# Patient Record
Sex: Female | Born: 1960 | Race: White | Hispanic: No | Marital: Married | Smoking: Former smoker
Health system: Southern US, Community
[De-identification: ages and names within clinical notes are randomized; demographics above are authoritative.]

## PROBLEM LIST (undated history)

## (undated) DIAGNOSIS — G43909 Migraine, unspecified, not intractable, without status migrainosus: Secondary | ICD-10-CM

## (undated) DIAGNOSIS — Z9109 Other allergy status, other than to drugs and biological substances: Secondary | ICD-10-CM

## (undated) DIAGNOSIS — I10 Essential (primary) hypertension: Secondary | ICD-10-CM

## (undated) DIAGNOSIS — G43109 Migraine with aura, not intractable, without status migrainosus: Secondary | ICD-10-CM

## (undated) DIAGNOSIS — G43111 Migraine with aura, intractable, with status migrainosus: Secondary | ICD-10-CM

## (undated) DIAGNOSIS — R42 Dizziness and giddiness: Secondary | ICD-10-CM

## (undated) HISTORY — PX: FRACTURE SURGERY: SHX138

## (undated) HISTORY — PX: NASAL SINUS SURGERY: SHX719

## (undated) HISTORY — PX: GANGLION CYST EXCISION: SHX1691

## (undated) HISTORY — PX: TOTAL ABDOMINAL HYSTERECTOMY: SHX209

## (undated) HISTORY — PX: SHOULDER ARTHROSCOPY: SHX128

## (undated) HISTORY — DX: Essential (primary) hypertension: I10

## (undated) HISTORY — PX: LUMBAR FUSION: SHX111

---

## 2015-07-16 ENCOUNTER — Encounter: Payer: Self-pay | Admitting: Neurology

## 2015-07-16 ENCOUNTER — Ambulatory Visit (INDEPENDENT_AMBULATORY_CARE_PROVIDER_SITE_OTHER): Payer: BLUE CROSS/BLUE SHIELD | Admitting: Neurology

## 2015-07-16 VITALS — BP 114/74 | HR 83 | Ht 66.0 in | Wt 184.0 lb

## 2015-07-16 DIAGNOSIS — G43009 Migraine without aura, not intractable, without status migrainosus: Secondary | ICD-10-CM | POA: Diagnosis not present

## 2015-07-16 DIAGNOSIS — G43109 Migraine with aura, not intractable, without status migrainosus: Secondary | ICD-10-CM | POA: Diagnosis not present

## 2015-07-16 DIAGNOSIS — G43809 Other migraine, not intractable, without status migrainosus: Secondary | ICD-10-CM | POA: Insufficient documentation

## 2015-07-16 MED ORDER — PREDNISONE 10 MG PO TABS: ORAL_TABLET | ORAL | Status: DC

## 2015-07-16 NOTE — Patient Instructions (Signed)
1.  To help stop the dizziness, will try prednisone taper.  Take 6tabs x1day, then 5tabs x1day, then 4tabs x1day, then 3tabs x1day, then 2tabs x1day, then 1tab x1day, then STOP 2.  Continue amitriptyline 25mg  at bedtime. 3.  Will get MRI and MRA of the head. 4.  Follow up in 3 months but call in 4 weeks (or sooner) if needed.

## 2015-07-16 NOTE — Progress Notes (Signed)
Chart forwarded.  

## 2015-07-16 NOTE — Progress Notes (Signed)
NEUROLOGY CONSULTATION NOTE  Christy Clayton MRN: [id number] DOB: [birth year]  Referring provider: Dr. Hyman Hopes Primary care provider: Dr. Hyman Hopes  Reason for consult:  Vestibular migraine  HISTORY OF PRESENT ILLNESS: Christy Clayton is a 54 year old right-handed female who presents for vestibular migraine.  History obtained by patient and PCP note.  She has had migraines "all of my life."  They start in the back of her neck and radiate to the left side of her head and face.  It is a 10/10 throbbing pain.  They are associated with blurred vision, nausea, vomiting, photophobia and phonophobia.  They last several hours without medication but abort in 2 hours after two dose of Frova.  They occur 4-5 times a year.  There are no particular triggers.  Her father has migraines.  Over the past 5 years, she has experienced vertigo and dizziness following some migraines.  It seems to occur in the autumn or spring.  She initially feels spinning sensation which becomes a persistent lightheadedness.  She continues to feel nauseous.  They usually last a week but last autumn and currently, it has persisted for 5 weeks.  Last year, she was out of work for a month.  Her last migraine was 5 weeks ago, and she continues to have persistent dizziness.  She has seen ENT in the past with unremarkable evaluation.  Current abortive medication:  Frova, meclizine , Zofran  Current preventative medication:  Amitriptyline  (just increased from  on 07/04/15).  Past abortive medication:  Imitrex.  Unable to use injections due to anxiety. Past preventative medication:  none  PAST MEDICAL HISTORY: Past Medical History  Diagnosis Date  . Diabetes mellitus without complication (HCC)   . Hypertension     PAST SURGICAL HISTORY: Past Surgical History  Procedure Laterality Date  . Total abdominal hysterectomy    . Ganglion cyst excision Left     Wrist    MEDICATIONS: No current outpatient prescriptions on file  prior to visit.   No current facility-administered medications on file prior to visit.    ALLERGIES: Allergies  Allergen Reactions  . Allegra [Fexofenadine] Nausea And Vomiting  . Celebrex [Celecoxib]   . Codeine   . Neurontin [Gabapentin] Nausea And Vomiting  . Promethazine Nausea And Vomiting  . Sulfa Antibiotics Nausea And Vomiting  . Cortaid [Hydrocortisone] Rash    FAMILY HISTORY: History reviewed. No pertinent family history.  SOCIAL HISTORY: Social History   Social History  . Marital Status: Married    Spouse Name: N/A  . Number of Children: N/A  . Years of Education: N/A   Occupational History  . Not on file.   Social History Main Topics  . Smoking status: Former Games developer  . Smokeless tobacco: Not on file  . Alcohol Use: Not on file  . Drug Use: Not on file  . Sexual Activity: Not on file   Other Topics Concern  . Not on file   Social History Narrative    REVIEW OF SYSTEMS: Constitutional: No fevers, chills, or sweats, no generalized fatigue, change in appetite Eyes: No visual changes, double vision, eye pain Ear, nose and throat: No hearing loss, ear pain, nasal congestion, sore throat Cardiovascular: No chest pain, palpitations Respiratory:  No shortness of breath at rest or with exertion, wheezes GastrointestinaI: No nausea, vomiting, diarrhea, abdominal pain, fecal incontinence Genitourinary:  No dysuria, urinary retention or frequency Musculoskeletal:  No neck pain, back pain Integumentary: No rash, pruritus, skin lesions Neurological: as above Psychiatric: No  depression, insomnia, anxiety Endocrine: No palpitations, fatigue, diaphoresis, mood swings, change in appetite, change in weight, increased thirst Hematologic/Lymphatic:  No anemia, purpura, petechiae. Allergic/Immunologic: no itchy/runny eyes, nasal congestion, recent allergic reactions, rashes  PHYSICAL EXAM: Filed Vitals:   07/16/15 0804  BP: 114/74  Pulse: 83   General: No acute  distress.  Patient appears well-groomed.  Head:  Normocephalic/atraumatic Eyes:  fundi unremarkable, without vessel changes, exudates, hemorrhages or papilledema. Neck: supple, no paraspinal tenderness, full range of motion Back: No paraspinal tenderness Heart: regular rate and rhythm Lungs: Clear to auscultation bilaterally. Vascular: No carotid bruits. Neurological Exam: Mental status: alert and oriented to person, place, and time, recent and remote memory intact, fund of knowledge intact, attention and concentration intact, speech fluent and not dysarthric, language intact. Cranial nerves: CN I: not tested CN II: pupils equal, round and reactive to light, visual fields intact, fundi unremarkable, without vessel changes, exudates, hemorrhages or papilledema. CN III, IV, VI:  full range of motion, no nystagmus, no ptosis CN V: facial sensation intact CN VII: upper and lower face symmetric CN VIII: hearing intact CN IX, X: gag intact, uvula midline CN XI: sternocleidomastoid and trapezius muscles intact CN XII: tongue midline Bulk & Tone: normal, no fasciculations. Motor:  5/5 throughout Sensation: temperature and vibration sensation intact. Deep Tendon Reflexes:  2+ throughout, toes downgoing.  Finger to nose testing:  Without dysmetria.  Heel to shin:  Without dysmetria.  Gait:  Normal station and stride.  Able to turn and tandem walk, but cautiously. Romberg with mild sway.  IMPRESSION: Possible vestibular migraines or migraine associated vertigo  PLAN: 1.  Since symptoms are persistent for the past 5 weeks, will get MRI and MRA of the head to look at the vertebrobasilar system 2.  Continue current regimen of Frova and Zofran 8mg  and meclizine as needed.  Ideally, an injectable triptan and anti-emetic would be ideal but this injections cause anxiety 3.  Continue amitriptyline 25mg  daily 4.  Will prescribe prednisone taper. 5.  Follow up in 3 months but she is to call sooner if  needed.  Consider vestibular rehab  Thank you for allowing me to take part in the care of this patient.  Shon MilletAdam Jaffe, DO  CC: Shirlean Mylararol Webb, MD

## 2015-07-17 ENCOUNTER — Encounter (HOSPITAL_COMMUNITY): Payer: Self-pay | Admitting: *Deleted

## 2015-07-17 ENCOUNTER — Observation Stay (HOSPITAL_COMMUNITY)
Admission: EM | Admit: 2015-07-17 | Discharge: 2015-07-19 | Disposition: A | Discharge status: Home / Self Care | Payer: BLUE CROSS/BLUE SHIELD | Attending: Internal Medicine | Admitting: Internal Medicine

## 2015-07-17 ENCOUNTER — Emergency Department (HOSPITAL_COMMUNITY): Payer: BLUE CROSS/BLUE SHIELD

## 2015-07-17 DIAGNOSIS — I671 Cerebral aneurysm, nonruptured: Secondary | ICD-10-CM | POA: Insufficient documentation

## 2015-07-17 DIAGNOSIS — Z79899 Other long term (current) drug therapy: Secondary | ICD-10-CM | POA: Diagnosis not present

## 2015-07-17 DIAGNOSIS — R200 Anesthesia of skin: Secondary | ICD-10-CM

## 2015-07-17 DIAGNOSIS — G5602 Carpal tunnel syndrome, left upper limb: Secondary | ICD-10-CM | POA: Diagnosis not present

## 2015-07-17 DIAGNOSIS — G43809 Other migraine, not intractable, without status migrainosus: Secondary | ICD-10-CM | POA: Diagnosis not present

## 2015-07-17 DIAGNOSIS — Z7982 Long term (current) use of aspirin: Secondary | ICD-10-CM | POA: Insufficient documentation

## 2015-07-17 DIAGNOSIS — G43109 Migraine with aura, not intractable, without status migrainosus: Secondary | ICD-10-CM

## 2015-07-17 DIAGNOSIS — R531 Weakness: Secondary | ICD-10-CM

## 2015-07-17 DIAGNOSIS — R202 Paresthesia of skin: Secondary | ICD-10-CM | POA: Diagnosis present

## 2015-07-17 DIAGNOSIS — I10 Essential (primary) hypertension: Secondary | ICD-10-CM | POA: Diagnosis not present

## 2015-07-17 DIAGNOSIS — E876 Hypokalemia: Secondary | ICD-10-CM | POA: Insufficient documentation

## 2015-07-17 DIAGNOSIS — I639 Cerebral infarction, unspecified: Secondary | ICD-10-CM

## 2015-07-17 HISTORY — DX: Migraine, unspecified, not intractable, without status migrainosus: G43.909

## 2015-07-17 HISTORY — DX: Other allergy status, other than to drugs and biological substances: Z91.09

## 2015-07-17 LAB — CBC
HCT: 42.5 % (ref 36.0–46.0)
HEMOGLOBIN: 14.5 g/dL (ref 12.0–15.0)
MCH: 30.5 pg (ref 26.0–34.0)
MCHC: 34.1 g/dL (ref 30.0–36.0)
MCV: 89.3 fL (ref 78.0–100.0)
Platelets: 351 10*3/uL (ref 150–400)
RBC: 4.76 MIL/uL (ref 3.87–5.11)
RDW: 12.4 % (ref 11.5–15.5)
WBC: 10.5 10*3/uL (ref 4.0–10.5)

## 2015-07-17 LAB — BASIC METABOLIC PANEL
ANION GAP: 11 (ref 5–15)
BUN: 16 mg/dL (ref 6–20)
CALCIUM: 9.7 mg/dL (ref 8.9–10.3)
CO2: 23 mmol/L (ref 22–32)
CREATININE: 0.86 mg/dL (ref 0.44–1.00)
Chloride: 102 mmol/L (ref 101–111)
GFR calc Af Amer: >60 mL/min (ref >60)
GFR calc non Af Amer: >60 mL/min (ref >60)
GLUCOSE: 150 mg/dL — AB (ref 65–99)
Potassium: 3.8 mmol/L (ref 3.5–5.1)
Sodium: 136 mmol/L (ref 135–145)

## 2015-07-17 LAB — I-STAT TROPONIN, ED
TROPONIN I, POC: 0 ng/mL (ref 0.00–0.08)
TROPONIN I, POC: 0 ng/mL (ref 0.00–0.08)

## 2015-07-17 LAB — DIFFERENTIAL
BASOS ABS: 0 10*3/uL (ref 0.0–0.1)
BASOS PCT: 0 %
EOS ABS: 0 10*3/uL (ref 0.0–0.7)
Eosinophils Relative: 0 %
LYMPHS ABS: 1.9 10*3/uL (ref 0.7–4.0)
Lymphocytes Relative: 18 %
Monocytes Absolute: 0.7 10*3/uL (ref 0.1–1.0)
Monocytes Relative: 6 %
NEUTROS PCT: 76 %
Neutro Abs: 7.8 10*3/uL — ABNORMAL HIGH (ref 1.7–7.7)

## 2015-07-17 LAB — I-STAT CHEM 8, ED
BUN: 17 mg/dL (ref 6–20)
CREATININE: 0.7 mg/dL (ref 0.44–1.00)
Calcium, Ion: 1.08 mmol/L — ABNORMAL LOW (ref 1.12–1.23)
Chloride: 103 mmol/L (ref 101–111)
GLUCOSE: 127 mg/dL — AB (ref 65–99)
HCT: 44 % (ref 36.0–46.0)
HEMOGLOBIN: 15 g/dL (ref 12.0–15.0)
POTASSIUM: 3.9 mmol/L (ref 3.5–5.1)
Sodium: 137 mmol/L (ref 135–145)
TCO2: 25 mmol/L (ref 0–100)

## 2015-07-17 LAB — PROTIME-INR
INR: 0.98 (ref 0.00–1.49)
Prothrombin Time: 13.2 seconds (ref 11.6–15.2)

## 2015-07-17 LAB — APTT: APTT: 29 s (ref 24–37)

## 2015-07-17 LAB — ETHANOL: Alcohol, Ethyl (B): <5 mg/dL (ref <5)

## 2015-07-17 NOTE — ED Notes (Signed)
Pt states that she is being treated for migraines and began taking Prednisone this morning; pt states that she took 60mg  of Presnisone this am; pt states that she has progressed to having heart palpitations and feeling discomfort to her chest and left arm is numb and tingling; pt states that she had taken Prednisone in the past and had not had this problem; pt denies shortness of breath or N/V

## 2015-07-17 NOTE — ED Provider Notes (Signed)
CSN: [id number]     Arrival date & time 07/17/15  2059 History   First MD Initiated Contact with Patient 07/17/15 2125     Chief Complaint  Patient presents with  . Chest Pain     (Consider location/radiation/quality/duration/timing/severity/associated sxs/prior Treatment) HPI   Christy Clayton is a 54 y.o. female with PMH significant for HTN, migraines, and environmental allergies who presents with constant, moderate, worsening left upper extremity and left facial numbness and tingling that began around 7:30 PM this evening.  She reports she was seen yesterday by Neurology for vestibular migraines and prescribed a prednisone taper.  She took 60 mg of prednisone this morning. She states she has taken  She also reports shakiness, weakness, dizziness, lightheadedness, and palpitations that began this evening.  Denies CP, SOB, HA, visual disturbances, N/V, or abdominal pain.  No injury, fall, or trauma.    Past Medical History  Diagnosis Date  . Hypertension   . Migraines   . Environmental allergies    Past Surgical History  Procedure Laterality Date  . Total abdominal hysterectomy    . Ganglion cyst excision Left     Wrist  . Lumbar fusion    . Nasal sinus surgery    . Fracture surgery      left arm  . Shoulder arthroscopy     No family history on file. Social History  Substance Use Topics  . Smoking status: Former Games developer  . Smokeless tobacco: None  . Alcohol Use: 0.0 oz/week    0 Standard drinks or equivalent per week     Comment: occ   OB History    No data available     Review of Systems All other systems negative unless otherwise stated in HPI    Allergies  Allegra; Celebrex; Codeine; Neurontin; Oxycodone; Sulfa antibiotics; Cortaid; and Promethazine  Home Medications   Prior to Admission medications   Medication Sig Start Date End Date Taking? Authorizing Provider  amitriptyline (ELAVIL) 25 MG tablet Take 25 mg by mouth at bedtime.   Yes Historical Provider,  MD  cetirizine (ZYRTEC) 10 MG tablet Take 10 mg by mouth daily.   Yes Historical Provider, MD  esomeprazole (NEXIUM) 40 MG capsule Take 40 mg by mouth daily at 12 noon.   Yes Historical Provider, MD  fosinopril-hydrochlorothiazide (MONOPRIL-HCT) 10-12.5 MG tablet Take 1 tablet by mouth daily.   Yes Historical Provider, MD  frovatriptan (FROVA) 2.5 MG tablet Take 2.5 mg by mouth as needed for migraine. If recurs, may repeat after 2 hours. Max of 3 tabs in 24 hours.   Yes Historical Provider, MD  meclizine (ANTIVERT) 25 MG tablet Take 25 mg by mouth 3 (three) times daily as needed for dizziness.   Yes Historical Provider, MD  Multiple Vitamins-Minerals (MULTIVITAMIN WOMEN PO) Take by mouth.   Yes Historical Provider, MD  ondansetron (ZOFRAN) 8 MG tablet Take by mouth every 8 (eight) hours as needed for nausea or vomiting.   Yes Historical Provider, MD  predniSONE (DELTASONE) 10 MG tablet Take 6tabs x1day, then 5tabs x1day, then 4tabs x1day, then 3tabs x1day, then 2tabs x1day, then 1tab x1day, then STOP 07/16/15  Yes Adam R Jaffe, DO   BP 128/92 mmHg  Pulse 87  Temp(Src) 98.3 F (36.8 C) (Oral)  Resp 23  Ht  (1.676 m)  Wt 83.008 kg  BMI 29.55 kg/m2  SpO2 94% Physical Exam  Constitutional: She is oriented to person, place, and time. She appears well-developed and well-nourished.  HENT:  Head: Normocephalic and atraumatic.  Mouth/Throat: Oropharynx is clear and moist.  Eyes: Conjunctivae are normal. Pupils are equal, round, and reactive to light.  Neck: Normal range of motion. Neck supple.  Cardiovascular: Normal rate, regular rhythm and normal heart sounds.   No murmur heard. Pulmonary/Chest: Effort normal and breath sounds normal. No accessory muscle usage or stridor. No respiratory distress. She has no wheezes. She has no rhonchi. She has no rales.  Abdominal: Soft. Bowel sounds are normal. She exhibits no distension. There is no tenderness.  Musculoskeletal: Normal range of motion.  She exhibits no tenderness.  Lymphadenopathy:    She has no cervical adenopathy.  Neurological: She is alert and oriented to person, place, and time.  Speech clear without dysarthria.  Cranial nerves grossly intact.  Sensation intact in trigeminal nerve distribution.  Slightly decreased strength in LUE as compared to RUE.  Sensation intact bilaterally throughout upper extremities.  Skin: Skin is warm and dry.  Psychiatric: She has a normal mood and affect. Her behavior is normal.    ED Course  Procedures (including critical care time) Labs Review Labs Reviewed  BASIC METABOLIC PANEL - Abnormal; Notable for the following:    Glucose, Bld 150 (*)    All other components within normal limits  DIFFERENTIAL - Abnormal; Notable for the following:    Neutro Abs 7.8 (*)    All other components within normal limits  I-STAT CHEM 8, ED - Abnormal; Notable for the following:    Glucose, Bld 127 (*)    Calcium, Ion 1.08 (*)    All other components within normal limits  CBC  ETHANOL  PROTIME-INR  APTT  URINE RAPID DRUG SCREEN, HOSP PERFORMED  URINALYSIS, ROUTINE W REFLEX MICROSCOPIC (NOT AT [facility])  Rosezena Sensor, ED  Rosezena Sensor, ED    Imaging Review Dg Chest 2 View  07/17/2015  CLINICAL DATA:  Chest pain and heart palpitations. Left arm is numb and tingling. Symptoms began after patient took prednisone. EXAM: CHEST  2 VIEW COMPARISON:  None. FINDINGS: Normal heart size and pulmonary vascularity. No focal airspace disease or consolidation in the lungs. No blunting of costophrenic angles. No pneumothorax. Mediastinal contours appear intact. Postoperative changes in the right shoulder. IMPRESSION: No active cardiopulmonary disease. Electronically Signed   By: Burman Nieves M.D.   On: 07/17/2015 21:46   Ct Head Wo Contrast  07/17/2015  CLINICAL DATA:  Left-sided arm and facial numbness. Onset today at 1930 hours. Code stroke. EXAM: CT HEAD WITHOUT CONTRAST TECHNIQUE: Contiguous axial  images were obtained from the base of the skull through the vertex without intravenous contrast. COMPARISON:  None. FINDINGS: Ventricles and sulci appear symmetrical. No ventricular dilatation. No mass effect or midline shift. No abnormal extra-axial fluid collections. Gray-white matter junctions are distinct. Basal cisterns are not effaced. No evidence of acute intracranial hemorrhage. No depressed skull fractures. Mild mucosal thickening in the paranasal sinuses. Loss of septation in the ethmoid air cells consistent postoperative change. Bilateral ostiomeatal windows interpreted tectum use. No acute air-fluid levels in the paranasal sinuses. Mastoid air cells are not opacified. Congenital nonunion of the posterior arch of C1. IMPRESSION: No acute intracranial abnormalities. These results were called by telephone at the time of interpretation on 07/17/2015 at 11:21 pm to Dr. Alvira Monday , who verbally acknowledged these results. Electronically Signed   By: Burman Nieves M.D.   On: 07/17/2015 23:23   I have personally reviewed and evaluated these images and lab results as part of my medical  decision-making.   EKG Interpretation None      MDM   Final diagnoses:  Left-sided weakness  Numbness and tingling of left side of face  Numbness and tingling in left arm    Patient presents with left facial and LUE numbness and tingling since 7:30 PM this evening.  Patient reports left sided weakness as well.  VS with mild tachycardia at 104.  On exam, heart RRR, lungs CTAB, abdomen soft and benign.  Left weakness noted in upper extremity.  Sensation intact.  Will obtain labs.  Patient was seen by Dr. Dalene SeltzerSchlossman as well. Concern for stroke.  Code stroke activated.  Labs unremarkable.  EKG not acute.  CT head negative for intracranial abnormalities. [facility].     Cheri FowlerKayla Montine Hight, PA-C 07/18/15 [id number]  Alvira MondayErin Schlossman, MD 07/21/15 1356

## 2015-07-17 NOTE — ED Notes (Signed)
[facility].

## 2015-07-17 NOTE — ED Notes (Signed)
Patient transported to CT 

## 2015-07-18 ENCOUNTER — Observation Stay (HOSPITAL_COMMUNITY): Payer: BLUE CROSS/BLUE SHIELD

## 2015-07-18 ENCOUNTER — Encounter (HOSPITAL_COMMUNITY): Payer: Self-pay | Admitting: Emergency Medicine

## 2015-07-18 ENCOUNTER — Observation Stay (HOSPITAL_BASED_OUTPATIENT_CLINIC_OR_DEPARTMENT_OTHER): Payer: BLUE CROSS/BLUE SHIELD

## 2015-07-18 DIAGNOSIS — R202 Paresthesia of skin: Secondary | ICD-10-CM | POA: Diagnosis not present

## 2015-07-18 DIAGNOSIS — I10 Essential (primary) hypertension: Secondary | ICD-10-CM | POA: Diagnosis not present

## 2015-07-18 DIAGNOSIS — G459 Transient cerebral ischemic attack, unspecified: Secondary | ICD-10-CM | POA: Diagnosis not present

## 2015-07-18 DIAGNOSIS — I639 Cerebral infarction, unspecified: Secondary | ICD-10-CM | POA: Diagnosis not present

## 2015-07-18 DIAGNOSIS — G43109 Migraine with aura, not intractable, without status migrainosus: Secondary | ICD-10-CM | POA: Diagnosis not present

## 2015-07-18 DIAGNOSIS — R200 Anesthesia of skin: Secondary | ICD-10-CM | POA: Insufficient documentation

## 2015-07-18 DIAGNOSIS — G43909 Migraine, unspecified, not intractable, without status migrainosus: Secondary | ICD-10-CM | POA: Diagnosis not present

## 2015-07-18 DIAGNOSIS — R531 Weakness: Secondary | ICD-10-CM | POA: Insufficient documentation

## 2015-07-18 LAB — CBC
HCT: 39.6 % (ref 36.0–46.0)
HEMOGLOBIN: 13 g/dL (ref 12.0–15.0)
MCH: 29.7 pg (ref 26.0–34.0)
MCHC: 32.8 g/dL (ref 30.0–36.0)
MCV: 90.6 fL (ref 78.0–100.0)
Platelets: 292 10*3/uL (ref 150–400)
RBC: 4.37 MIL/uL (ref 3.87–5.11)
RDW: 12.6 % (ref 11.5–15.5)
WBC: 12.3 10*3/uL — ABNORMAL HIGH (ref 4.0–10.5)

## 2015-07-18 LAB — RAPID URINE DRUG SCREEN, HOSP PERFORMED
Amphetamines: NOT DETECTED
BARBITURATES: NOT DETECTED
BENZODIAZEPINES: NOT DETECTED
COCAINE: NOT DETECTED
OPIATES: NOT DETECTED
TETRAHYDROCANNABINOL: NOT DETECTED

## 2015-07-18 LAB — URINALYSIS, ROUTINE W REFLEX MICROSCOPIC
Bilirubin Urine: NEGATIVE
GLUCOSE, UA: NEGATIVE mg/dL
Ketones, ur: NEGATIVE mg/dL
Leukocytes, UA: NEGATIVE
Nitrite: NEGATIVE
PH: 6 (ref 5.0–8.0)
PROTEIN: NEGATIVE mg/dL
Specific Gravity, Urine: 1.027 (ref 1.005–1.030)

## 2015-07-18 LAB — COMPREHENSIVE METABOLIC PANEL
ALK PHOS: 48 U/L (ref 38–126)
ALT: 44 U/L (ref 14–54)
AST: 29 U/L (ref 15–41)
Albumin: 3.8 g/dL (ref 3.5–5.0)
Anion gap: 11 (ref 5–15)
BILIRUBIN TOTAL: 0.7 mg/dL (ref 0.3–1.2)
BUN: 15 mg/dL (ref 6–20)
CALCIUM: 9.3 mg/dL (ref 8.9–10.3)
CO2: 24 mmol/L (ref 22–32)
CREATININE: 0.88 mg/dL (ref 0.44–1.00)
Chloride: 101 mmol/L (ref 101–111)
Glucose, Bld: 178 mg/dL — ABNORMAL HIGH (ref 65–99)
Potassium: 3.4 mmol/L — ABNORMAL LOW (ref 3.5–5.1)
Sodium: 136 mmol/L (ref 135–145)
TOTAL PROTEIN: 6.4 g/dL — AB (ref 6.5–8.1)

## 2015-07-18 LAB — URINE MICROSCOPIC-ADD ON

## 2015-07-18 LAB — LIPID PANEL
CHOL/HDL RATIO: 4.4 ratio
CHOLESTEROL: 242 mg/dL — AB (ref 0–200)
HDL: 55 mg/dL (ref >40)
LDL CALC: 155 mg/dL — AB (ref 0–99)
TRIGLYCERIDES: 161 mg/dL — AB (ref <150)
VLDL: 32 mg/dL (ref 0–40)

## 2015-07-18 MED ORDER — MECLIZINE HCL 12.5 MG PO TABS: 25.0000 mg | ORAL_TABLET | Freq: Three times a day (TID) | ORAL | Status: DC | PRN

## 2015-07-18 MED ORDER — PANTOPRAZOLE SODIUM 40 MG PO TBEC
40.0000 mg | DELAYED_RELEASE_TABLET | Freq: Every day | ORAL | Status: DC
  Administered 2015-07-18 – 2015-07-19 (×2): 40 mg via ORAL
  Filled 2015-07-18 (×3): qty 1

## 2015-07-18 MED ORDER — ASPIRIN 325 MG PO TABS
325.0000 mg | ORAL_TABLET | Freq: Every day | ORAL | Status: DC
  Administered 2015-07-18 – 2015-07-19 (×2): 325 mg via ORAL
  Filled 2015-07-18 (×3): qty 1

## 2015-07-18 MED ORDER — STROKE: EARLY STAGES OF RECOVERY BOOK
Freq: Once | Status: AC
  Administered 2015-07-18: 05:00:00
  Filled 2015-07-18: qty 1

## 2015-07-18 MED ORDER — POTASSIUM CHLORIDE CRYS ER 20 MEQ PO TBCR
40.0000 meq | EXTENDED_RELEASE_TABLET | Freq: Once | ORAL | Status: AC
  Administered 2015-07-18: 40 meq via ORAL
  Filled 2015-07-18: qty 2

## 2015-07-18 MED ORDER — PREDNISONE 5 MG PO TABS: 30.0000 mg | ORAL_TABLET | Freq: Every day | ORAL | Status: DC

## 2015-07-18 MED ORDER — AMITRIPTYLINE HCL 25 MG PO TABS
25.0000 mg | ORAL_TABLET | Freq: Every day | ORAL | Status: DC
  Administered 2015-07-18: 25 mg via ORAL
  Filled 2015-07-18: qty 1

## 2015-07-18 MED ORDER — SENNOSIDES-DOCUSATE SODIUM 8.6-50 MG PO TABS: 1.0000 | ORAL_TABLET | Freq: Every evening | ORAL | Status: DC | PRN

## 2015-07-18 MED ORDER — PREDNISONE 50 MG PO TABS
50.0000 mg | ORAL_TABLET | Freq: Every day | ORAL | Status: AC
  Administered 2015-07-18: 50 mg via ORAL
  Filled 2015-07-18: qty 1

## 2015-07-18 MED ORDER — ENOXAPARIN SODIUM 40 MG/0.4ML ~~LOC~~ SOLN
40.0000 mg | SUBCUTANEOUS | Status: DC
  Administered 2015-07-18 – 2015-07-19 (×2): 40 mg via SUBCUTANEOUS
  Filled 2015-07-18 (×2): qty 0.4

## 2015-07-18 MED ORDER — FOSINOPRIL SODIUM-HCTZ 10-12.5 MG PO TABS: 1.0000 | ORAL_TABLET | Freq: Every day | ORAL | Status: DC

## 2015-07-18 MED ORDER — ASPIRIN 300 MG RE SUPP: 300.0000 mg | Freq: Every day | RECTAL | Status: DC

## 2015-07-18 MED ORDER — PREDNISONE 20 MG PO TABS: 20.0000 mg | ORAL_TABLET | Freq: Every day | ORAL | Status: DC

## 2015-07-18 MED ORDER — PREDNISONE 20 MG PO TABS
40.0000 mg | ORAL_TABLET | Freq: Every day | ORAL | Status: AC
  Administered 2015-07-19: 40 mg via ORAL
  Filled 2015-07-18 (×2): qty 2

## 2015-07-18 MED ORDER — SODIUM CHLORIDE 0.9 % IV SOLN
INTRAVENOUS | Status: DC
  Administered 2015-07-18: 05:00:00 via INTRAVENOUS

## 2015-07-18 MED ORDER — LISINOPRIL 10 MG PO TABS
10.0000 mg | ORAL_TABLET | Freq: Every day | ORAL | Status: DC
  Administered 2015-07-18 – 2015-07-19 (×2): 10 mg via ORAL
  Filled 2015-07-18 (×3): qty 1

## 2015-07-18 MED ORDER — HYDROCHLOROTHIAZIDE 12.5 MG PO CAPS
12.5000 mg | ORAL_CAPSULE | Freq: Every day | ORAL | Status: DC
  Administered 2015-07-18 – 2015-07-19 (×2): 12.5 mg via ORAL
  Filled 2015-07-18 (×3): qty 1

## 2015-07-18 MED ORDER — LORAZEPAM 2 MG/ML IJ SOLN
1.0000 mg | Freq: Once | INTRAMUSCULAR | Status: AC
  Administered 2015-07-18: 1 mg via INTRAVENOUS
  Filled 2015-07-18: qty 1

## 2015-07-18 MED ORDER — LORATADINE 10 MG PO TABS
10.0000 mg | ORAL_TABLET | Freq: Every day | ORAL | Status: DC
  Administered 2015-07-18 – 2015-07-19 (×2): 10 mg via ORAL
  Filled 2015-07-18 (×4): qty 1

## 2015-07-18 MED ORDER — PREDNISONE 5 MG PO TABS: 10.0000 mg | ORAL_TABLET | Freq: Every day | ORAL | Status: DC

## 2015-07-18 NOTE — Progress Notes (Signed)
Pt arrived to 5c10 from ED.  Pt is alert and oriented and still has tingling in her left arm.  Safety measures in place.  Will continue to monitor.   Estanislado EmmsAshley Schwarz, RN

## 2015-07-18 NOTE — Consult Note (Signed)
Admission H&P    Chief Complaint: Left side numbness and tingling, and vertigo.  HPI: Christy Clayton is an 54 y.o. female with a history of jejunostomy migraine headaches and hypertension presenting with new onset numbness and tingling involving left face arm and leg in addition to vertigo. Patient had a headache earlier resolved after taking sumatriptan. Vertigo and sensory abnormalities persisted, however. CT scan of her head showed no acute intracranial abnormality. Set of symptoms was at 7:30 PM on 07/17/2015. She has no history of sensory changes associated with headaches. She has not experienced focal weakness and had no speech change. NIH stroke score was 0.  LSN: 7:30 PM on 07/17/2015 tPA Given: No: No objective neurologic deficits; beyond time window for treatment consideration when she [facility]. mRankin:  Past Medical History  Diagnosis Date  . Hypertension   . Migraines   . Environmental allergies     Past Surgical History  Procedure Laterality Date  . Total abdominal hysterectomy    . Ganglion cyst excision Left     Wrist  . Lumbar fusion    . Nasal sinus surgery    . Fracture surgery      left arm  . Shoulder arthroscopy      No family history on file. Social History:  reports that she has quit smoking. She does not have any smokeless tobacco history on file. She reports that she drinks alcohol. She reports that she does not use illicit drugs.  Allergies:  Allergies  Allergen Reactions  . Allegra [Fexofenadine] Nausea And Vomiting  . Celebrex [Celecoxib] Nausea And Vomiting  . Codeine Nausea And Vomiting  . Neurontin [Gabapentin] Nausea And Vomiting  . Oxycodone Nausea And Vomiting  . Sulfa Antibiotics Nausea And Vomiting  . Cortaid [Hydrocortisone] Rash  . Promethazine Nausea And Vomiting and Rash    Medications: Patient's preadmission medications were reviewed by me.  ROS: History obtained from the patient  General ROS: negative for - chills,  fatigue, fever, night sweats, weight gain or weight loss Psychological ROS: negative for - behavioral disorder, hallucinations, memory difficulties, mood swings or suicidal ideation Ophthalmic ROS: negative for - blurry vision, double vision, eye pain or loss of vision ENT ROS: negative for - epistaxis, nasal discharge, oral lesions, sore throat, tinnitus or vertigo Allergy and Immunology ROS: negative for - hives or itchy/watery eyes Hematological and Lymphatic ROS: negative for - bleeding problems, bruising or swollen lymph nodes Endocrine ROS: negative for - galactorrhea, hair pattern changes, polydipsia/polyuria or temperature intolerance Respiratory ROS: negative for - cough, hemoptysis, shortness of breath or wheezing Cardiovascular ROS: negative for - chest pain, dyspnea on exertion, edema or irregular heartbeat Gastrointestinal ROS: negative for - abdominal pain, diarrhea, hematemesis, nausea/vomiting or stool incontinence Genito-Urinary ROS: negative for - dysuria, hematuria, incontinence or urinary frequency/urgency Musculoskeletal ROS: negative for - joint swelling or muscular weakness Neurological ROS: as noted in HPI Dermatological ROS: negative for rash and skin lesion changes  Physical Examination: Blood pressure 119/79, pulse 80, temperature 98 F (36.7 C), temperature source Oral, resp. rate 18, height 5' 6"  (1.676 m), weight 83.008 kg (183 lb), SpO2 97 %.  HEENT-  Normocephalic, no lesions, without obvious abnormality.  Normal external eye and conjunctiva.  Normal TM's bilaterally.  Normal auditory canals and external ears. Normal external nose, mucus membranes and septum.  Normal pharynx. Neck supple with no masses, nodes, nodules or enlargement. Cardiovascular - regular rate and rhythm, S1, S2 normal, no murmur, click, rub or gallop  Lungs - chest clear, no wheezing, rales, normal symmetric air entry Abdomen - soft, non-tender; bowel sounds normal; no masses,  no  organomegaly Extremities - no joint deformities, effusion, or inflammation and no edema  Neurologic Examination: Mental Status: Alert, oriented, thought content appropriate.  Speech fluent without evidence of aphasia. Able to follow commands without difficulty. Cranial Nerves: II-Visual fields were normal. III/IV/VI-Pupils were equal and reacted normally to light. Extraocular movements were full and conjugate.    V/VII-no facial numbness and no facial weakness. VIII-normal. X-normal speech and symmetrical palatal movement. XI: trapezius strength/neck flexion strength normal bilaterally XII-midline tongue extension with normal strength. Motor: 5/5 bilaterally with normal tone and bulk Sensory: Normal throughout. Deep Tendon Reflexes: 2+ and symmetric. Plantars: Flexor bilaterally Cerebellar: Normal finger-to-nose testing. Carotid auscultation: Normal  Results for orders placed or performed during the hospital encounter of 07/17/15 (from the past 48 hour(s))  Basic metabolic panel     Status: Abnormal   Collection Time: 07/17/15  9:23 PM  Result Value Ref Range   Sodium 136 135 - 145 mmol/L   Potassium 3.8 3.5 - 5.1 mmol/L   Chloride 102 101 - 111 mmol/L   CO2 23 22 - 32 mmol/L   Glucose, Bld 150 (H) 65 - 99 mg/dL   BUN 16 6 - 20 mg/dL   Creatinine, Ser 0.86 0.44 - 1.00 mg/dL   Calcium 9.7 8.9 - 10.3 mg/dL   GFR calc non Af Amer >60 >60 mL/min   GFR calc Af Amer >60 >60 mL/min    Comment: (NOTE) The eGFR has been calculated using the CKD EPI equation. This calculation has not been validated in all clinical situations. eGFR's persistently <60 mL/min signify possible Chronic Kidney Disease.    Anion gap 11 5 - 15  CBC     Status: None   Collection Time: 07/17/15  9:23 PM  Result Value Ref Range   WBC 10.5 4.0 - 10.5 K/uL   RBC 4.76 3.87 - 5.11 MIL/uL   Hemoglobin 14.5 12.0 - 15.0 g/dL   HCT 42.5 36.0 - 46.0 %   MCV 89.3 78.0 - 100.0 fL   MCH 30.5 26.0 - 34.0 pg   MCHC  34.1 30.0 - 36.0 g/dL   RDW 12.4 11.5 - 15.5 %   Platelets 351 150 - 400 K/uL  Differential     Status: Abnormal   Collection Time: 07/17/15  9:23 PM  Result Value Ref Range   Neutrophils Relative % 76 %   Neutro Abs 7.8 (H) 1.7 - 7.7 K/uL   Lymphocytes Relative 18 %   Lymphs Abs 1.9 0.7 - 4.0 K/uL   Monocytes Relative 6 %   Monocytes Absolute 0.7 0.1 - 1.0 K/uL   Eosinophils Relative 0 %   Eosinophils Absolute 0.0 0.0 - 0.7 K/uL   Basophils Relative 0 %   Basophils Absolute 0.0 0.0 - 0.1 K/uL  I-stat troponin, ED (not at [facility], [facility])     Status: None   Collection Time: 07/17/15  9:33 PM  Result Value Ref Range   Troponin i, poc 0.00 0.00 - 0.08 ng/mL   Comment 3            Comment: Due to the release kinetics of cTnI, a negative result within the first hours of the onset of symptoms does not rule out myocardial infarction with certainty. If myocardial infarction is still suspected, repeat the test at appropriate intervals.   Ethanol     Status: None  Collection Time: 07/17/15 11:05 PM  Result Value Ref Range   Alcohol, Ethyl (B) <5 <5 mg/dL    Comment:        LOWEST DETECTABLE LIMIT FOR SERUM ALCOHOL IS 5 mg/dL FOR MEDICAL PURPOSES ONLY   Protime-INR     Status: None   Collection Time: 07/17/15 11:05 PM  Result Value Ref Range   Prothrombin Time 13.2 11.6 - 15.2 seconds   INR 0.98 0.00 - 1.49  APTT     Status: None   Collection Time: 07/17/15 11:05 PM  Result Value Ref Range   aPTT 29 24 - 37 seconds  I-stat troponin, ED (not at [facility], [facility])     Status: None   Collection Time: 07/17/15 11:10 PM  Result Value Ref Range   Troponin i, poc 0.00 0.00 - 0.08 ng/mL   Comment 3            Comment: Due to the release kinetics of cTnI, a negative result within the first hours of the onset of symptoms does not rule out myocardial infarction with certainty. If myocardial infarction is still suspected, repeat the test at appropriate intervals.   I-Stat Chem 8, ED  (not at  [facility], [facility])     Status: Abnormal   Collection Time: 07/17/15 11:11 PM  Result Value Ref Range   Sodium 137 135 - 145 mmol/L   Potassium 3.9 3.5 - 5.1 mmol/L   Chloride 103 101 - 111 mmol/L   BUN 17 6 - 20 mg/dL   Creatinine, Ser 0.70 0.44 - 1.00 mg/dL   Glucose, Bld 127 (H) 65 - 99 mg/dL   Calcium, Ion 1.08 (L) 1.12 - 1.23 mmol/L   TCO2 25 0 - 100 mmol/L   Hemoglobin 15.0 12.0 - 15.0 g/dL   HCT 44.0 36.0 - 46.0 %   Dg Chest 2 View  07/17/2015  CLINICAL DATA:  Chest pain and heart palpitations. Left arm is numb and tingling. Symptoms began after patient took prednisone. EXAM: CHEST  2 VIEW COMPARISON:  None. FINDINGS: Normal heart size and pulmonary vascularity. No focal airspace disease or consolidation in the lungs. No blunting of costophrenic angles. No pneumothorax. Mediastinal contours appear intact. Postoperative changes in the right shoulder. IMPRESSION: No active cardiopulmonary disease. Electronically Signed   By: Lucienne Capers M.D.   On: 07/17/2015 21:46   Ct Head Wo Contrast  07/17/2015  CLINICAL DATA:  Left-sided arm and facial numbness. Onset today at 1930 hours. Code stroke. EXAM: CT HEAD WITHOUT CONTRAST TECHNIQUE: Contiguous axial images were obtained from the base of the skull through the vertex without intravenous contrast. COMPARISON:  None. FINDINGS: Ventricles and sulci appear symmetrical. No ventricular dilatation. No mass effect or midline shift. No abnormal extra-axial fluid collections. Gray-white matter junctions are distinct. Basal cisterns are not effaced. No evidence of acute intracranial hemorrhage. No depressed skull fractures. Mild mucosal thickening in the paranasal sinuses. Loss of septation in the ethmoid air cells consistent postoperative change. Bilateral ostiomeatal windows interpreted tectum use. No acute air-fluid levels in the paranasal sinuses. Mastoid air cells are not opacified. Congenital nonunion of the posterior arch of C1. IMPRESSION: No acute  intracranial abnormalities. These results were called by telephone at the time of interpretation on 07/17/2015 at 11:21 pm to Dr. Gareth Morgan , who verbally acknowledged these results. Electronically Signed   By: Lucienne Capers M.D.   On: 07/17/2015 23:23    Assessment: 54 y.o. female with a history of vestibular migraine headaches presenting with  vertigo and new-onset numbness and tingling involving left face arm and leg. Complicated migraine is a consideration. However, patient has stroke risk factors and TIA or possible right subcortical stroke and not be ruled out at this point.  Stroke Risk Factors - family history and hypertension  Plan: 1. HgbA1c, fasting lipid panel 2. MRI, MRA  of the brain without contrast 3. PT consult, OT consult, Speech consult 4. Echocardiogram 5. Carotid dopplers 6. Prophylactic therapy-Antiplatelet med: Aspirin 7. Risk factor modification 8. Telemetry monitoring  C.R. Nicole Kindred, MD [facility] [phone]  07/18/2015, 12:45 AM

## 2015-07-18 NOTE — ED Notes (Signed)
Dr. Roseanne RenoStewart and rapid response nurse at bedside.

## 2015-07-18 NOTE — Care Management Note (Signed)
Case Management Note  Patient Details  Name: Christy Clayton MRN: [id number] Date of Birth: April 15, 1961  Subjective/Objective:    Patient admitted with tingling of left arm and side of face. MRI negative for CVA. Patient is from home with spouse.                Action/Plan: Awaiting PT/OT recommendations. CM will continue to follow for discharge needs.   Expected Discharge Date:                  Expected Discharge Plan:     In-House Referral:     Discharge planning Services     Post Acute Care Choice:    Choice offered to:     DME Arranged:    DME Agency:     HH Arranged:    HH Agency:     Status of Service:  In process, will continue to follow  Medicare Important Message Given:    Date Medicare IM Given:    Medicare IM give by:    Date Additional Medicare IM Given:    Additional Medicare Important Message give by:     If discussed at Long Length of Stay Meetings, dates discussed:    Additional Comments:  Christy BaloKelli F Darryll Raju, RN 07/18/2015, 11:41 AM

## 2015-07-18 NOTE — Progress Notes (Signed)
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 It was explained to her this was not the cause of her symptoms. I also discussed with her that the fact she had neurological symptoms with her migraine she should not take Frova or any Triptan for abortive therapy as this may place her at risk for CVA. She should follow up with her primary neurologist after this hospitalization.  Appointment may be made by herself or by the IM MD. At that time she can discuss further prophylactic treatment for her migraines.    I also advised her to take ASA 81 mg daily if not contraindicated.   Patient understood.   Felicie MornDavid Senai Kingsley PA-C [facility] [phone]  07/18/2015, 3:15 PM

## 2015-07-18 NOTE — ED Notes (Signed)
[facility] called about ETA. Per dispatchers, did not have patient in system for need to transport. Informed that no vehicle would be available until 2 hours.  [facility] called to arrange transport. Consulting civil engineerCharge RN notified.

## 2015-07-18 NOTE — H&P (Signed)
[facility] History and Physical  Christy Clayton ZOX:[id number] DOB: 24-Oct-1960 DOA: 07/17/2015  Referring physician: Dr.Palumbo PCP: Frederich Chick, MD  Specialists: Josph Macho. Neurologist.  Chief Complaint: Left facial and hand tingling.  HPI: Christy Clayton is a 54 y.o. female with history of hypertension and recently diagnosed vestibular migraine was placed on prednisone and has taken her first dose of prednisone yesterday started developing numbness of the left face and left hand yesterday around 7 PM. Patient denies any visual symptoms difficulty speaking or swallowing or any weakness of the extremities. CT of the head did not show anything acute. On-call neurologist Dr. Roseanne Reno was consulted and patient has been admitted for further workup and possible stroke versus TIA. On exam patient is appearing nonfocal but still has tingling of the left face and left hand. Over the last 5 weeks patient has been having vertigo for which patient's primary care physician has increased her meclizine dose. Patient also had recently gone to her neurologist who had started on prednisone taper dose for vestibular migraine.  Review of Systems: As presented in the history of presenting illness, rest negative.  Past Medical History  Diagnosis Date  . Hypertension   . Migraines   . Environmental allergies    Past Surgical History  Procedure Laterality Date  . Total abdominal hysterectomy    . Ganglion cyst excision Left     Wrist  . Lumbar fusion    . Nasal sinus surgery    . Fracture surgery      left arm  . Shoulder arthroscopy     Social History:  reports that she has quit smoking. She does not have any smokeless tobacco history on file. She reports that she drinks alcohol. She reports that she does not use illicit drugs. Where does patient live Home. Can patient participate in ADLs? Yes.  Allergies  Allergen Reactions  . Allegra [Fexofenadine] Nausea And Vomiting  . Celebrex [Celecoxib]  Nausea And Vomiting  . Codeine Nausea And Vomiting  . Neurontin [Gabapentin] Nausea And Vomiting  . Oxycodone Nausea And Vomiting  . Sulfa Antibiotics Nausea And Vomiting  . Cortaid [Hydrocortisone] Rash  . Promethazine Nausea And Vomiting and Rash    Family History:  Family History  Problem Relation Age of Onset  . Migraines Father   . Stroke Paternal Grandmother       Prior to Admission medications   Medication Sig Start Date End Date Taking? Authorizing Provider  amitriptyline (ELAVIL) 25 MG tablet Take 25 mg by mouth at bedtime.   Yes Historical Provider, MD  cetirizine (ZYRTEC) 10 MG tablet Take 10 mg by mouth daily.   Yes Historical Provider, MD  esomeprazole (NEXIUM) 40 MG capsule Take 40 mg by mouth daily at 12 noon.   Yes Historical Provider, MD  fosinopril-hydrochlorothiazide (MONOPRIL-HCT) 10-12.5 MG tablet Take 1 tablet by mouth daily.   Yes Historical Provider, MD  frovatriptan (FROVA) 2.5 MG tablet Take 2.5 mg by mouth as needed for migraine. If recurs, may repeat after 2 hours. Max of 3 tabs in 24 hours.   Yes Historical Provider, MD  meclizine (ANTIVERT) 25 MG tablet Take 25 mg by mouth 3 (three) times daily as needed for dizziness.   Yes Historical Provider, MD  Multiple Vitamins-Minerals (MULTIVITAMIN WOMEN PO) Take by mouth.   Yes Historical Provider, MD  ondansetron (ZOFRAN) 8 MG tablet Take by mouth every 8 (eight) hours as needed for nausea or vomiting.   Yes Historical Provider, MD  predniSONE (DELTASONE) 10  MG tablet Take 6tabs x1day, then 5tabs x1day, then 4tabs x1day, then 3tabs x1day, then 2tabs x1day, then 1tab x1day, then STOP 07/16/15  Yes Drema Dallas, DO    Physical Exam: Filed Vitals:   07/18/15 0130 07/18/15 0245 07/18/15 0315 07/18/15 0339  BP: 106/65 109/74 118/80 114/77  Pulse: 80 79 67 65  Temp:    98.2 F (36.8 C)  TempSrc:    Oral  Resp: Height:      Weight:      SpO2: 94% 95% 94% 96%     General:  Moderately developed  and nourished.  Eyes: Anicteric no pallor.  ENT: No discharge from the ears eyes nose and mouth.  Neck: No mass felt.  Cardiovascular: S1 and S2 heard.  Respiratory: No rhonchi or crepitations.  Abdomen: Soft nontender bowel sounds present. No guarding or rigidity.  Skin: No rash.  Musculoskeletal: No edema.   Psychiatric: Appears normal.  Neurologic: Alert awake oriented to time place and person. Moves all extremities 5 x 5. No facial asymmetry. Tongue is midline. PERRLA positive.  Labs on Admission:  Basic Metabolic Panel:  Recent Labs Lab 07/17/15 2123 07/17/15 2311  NA 136 137  K 3.8 3.9  CL 102 103  CO2 23  --   GLUCOSE 150* 127*  BUN 16 17  CREATININE 0.86 0.70  CALCIUM 9.7  --    Liver Function Tests: No results for input(s): AST, ALT, ALKPHOS, BILITOT, PROT, ALBUMIN in the last 168 hours. No results for input(s): LIPASE, AMYLASE in the last 168 hours. No results for input(s): AMMONIA in the last 168 hours. CBC:  Recent Labs Lab 07/17/15 2123 07/17/15 2311  WBC 10.5  --   NEUTROABS 7.8*  --   HGB 14.5 15.0  HCT 42.5 44.0  MCV 89.3  --   PLT 351  --    Cardiac Enzymes: No results for input(s): CKTOTAL, CKMB, CKMBINDEX, TROPONINI in the last 168 hours.  BNP (last 3 results) No results for input(s): BNP in the last 8760 hours.  ProBNP (last 3 results) No results for input(s): PROBNP in the last 8760 hours.  CBG: No results for input(s): GLUCAP in the last 168 hours.  Radiological Exams on Admission: Dg Chest 2 View  07/17/2015  CLINICAL DATA:  Chest pain and heart palpitations. Left arm is numb and tingling. Symptoms began after patient took prednisone. EXAM: CHEST  2 VIEW COMPARISON:  None. FINDINGS: Normal heart size and pulmonary vascularity. No focal airspace disease or consolidation in the lungs. No blunting of costophrenic angles. No pneumothorax. Mediastinal contours appear intact. Postoperative changes in the right shoulder.  IMPRESSION: No active cardiopulmonary disease. Electronically Signed   By: Burman Nieves M.D.   On: 07/17/2015 21:46   Ct Head Wo Contrast  07/17/2015  CLINICAL DATA:  Left-sided arm and facial numbness. Onset today at 1930 hours. Code stroke. EXAM: CT HEAD WITHOUT CONTRAST TECHNIQUE: Contiguous axial images were obtained from the base of the skull through the vertex without intravenous contrast. COMPARISON:  None. FINDINGS: Ventricles and sulci appear symmetrical. No ventricular dilatation. No mass effect or midline shift. No abnormal extra-axial fluid collections. Gray-white matter junctions are distinct. Basal cisterns are not effaced. No evidence of acute intracranial hemorrhage. No depressed skull fractures. Mild mucosal thickening in the paranasal sinuses. Loss of septation in the ethmoid air cells consistent postoperative change. Bilateral ostiomeatal windows interpreted tectum use. No acute air-fluid levels in the paranasal sinuses. Mastoid air cells  are not opacified. Congenital nonunion of the posterior arch of C1. IMPRESSION: No acute intracranial abnormalities. These results were called by telephone at the time of interpretation on 07/17/2015 at 11:21 pm to Dr. Alvira MondayERIN SCHLOSSMAN , who verbally acknowledged these results. Electronically Signed   By: Burman NievesWilliam  Stevens M.D.   On: 07/17/2015 23:23    EKG: Independently reviewed. Sinus tachycardia.  Assessment/Plan Principal Problem:   Tingling of left arm and left side of face Active Problems:   Vestibular migraine   Essential hypertension   1. Left face and hand tingling  - appreciated neurology consult. At this time patient's symptoms are concerning for TIA versus CVA. Patient has been placed on neurochecks. Check MRI/MRA brain 2-D echo and carotid Doppler. Patient is on aspirin.  2. Vestibular migraine - patient's neurologist has recently placed patient on prednisone tapering dose. Which will be continued. 3. Hypertension -continue home  medications.    DVT Prophylaxis -Lovenox.  Code Status: Full code.  Family Communication: Discussed with patient.  Disposition Plan: Admit to inpatient.    Vijay Durflinger N. [facility] Pager [phone].  If 7PM-7AM, please contact night-coverage [email or web address] Password [email or web address] 07/18/2015, 3:59 AM

## 2015-07-18 NOTE — ED Provider Notes (Signed)
By signing my name below, I, Bethel BornBritney McCollum, attest that this documentation has been prepared under the direction and in the presence of Suyash Amory, MD. Electronically Signed: Bethel BornBritney McCollum, ED Scribe. 07/18/2015. 1:02 AM  Brought in as a transfer from Christy Clayton, Christy Clayton is a 54 y.o. female with PMH significant for HTN, migraines, and environmental allergies who presents with constant, moderate, worsening left upper extremity and left facial numbness and tingling that began around 7:30 PM last evening. The sensation in her face has not changed.   Physical Exam  Constitutional: She is oriented to person, place, and time and well-developed, well-nourished, and in no distress. No distress.  HENT:  Head: Normocephalic and atraumatic.  Mouth/Throat: Oropharynx is clear and moist.  Moist mucous membranes. No exudate.   Eyes: Conjunctivae and EOM are normal. Pupils are equal, round, and reactive to light.  Neck: Normal range of motion.  Cardiovascular: Normal rate and regular rhythm.   Pulmonary/Chest: Effort normal and breath sounds normal.  Abdominal: Soft. Bowel sounds are normal.  Musculoskeletal: Normal range of motion.  Neurological: She is alert and oriented to person, place, and time. She has normal reflexes. GCS score is 15.  Skin: Skin is warm and dry. She is not diaphoretic.  Nursing note and vitals reviewed.  1:02 AM Pt is stable for admission at this time. Plan to admit to telemetry inpatient per Dr. Toniann FailKakrakandy.   Final diagnoses:  Left-sided weakness  Numbness and tingling of left side of face  Numbness and tingling in left arm     I personally performed the services described in this documentation, which was scribed in my presence. The recorded information has been reviewed and is accurate.      Cy BlamerApril Loic Hobin, MD 07/18/15 [phone]

## 2015-07-18 NOTE — Progress Notes (Signed)
  Echocardiogram 2D Echocardiogram has been performed.  Tye SavoyCasey N Khailee Mick 07/18/2015, 9:49 AM

## 2015-07-18 NOTE — Progress Notes (Signed)
PROGRESS NOTE    Christy Clayton WUJ:[id number] DOB: 09/06/1960 DOA: 07/17/2015 PCP: Frederich ChickWEBB, CAROL D, MD  HPI/Brief narrative 54 year old female patient with history of HTN, recently diagnosed vestibular migraine and placed on prednisone-took first dose on 07/17/15, [facility]15 with numbness of left side of face and left hand that started around 7 PM. CT head negative for acute findings. Neurology was consulted and recommended admission for evaluation of stroke versus TIA versus complicated migraine.   Assessment/Plan:  Numbness of left face, arm and leg - Neurology consultation appreciated. Initial DD: Stroke, TIA, complicated migraine - MRI brain: Negative for stroke but shows small left ICA cavernous segment aneurysm which probably is an incidental finding. - MRA brain negative - CT head: No acute findings - 2-D echo: Pending - Carotid Dopplers: Pending - LDL 155 - A1c pending - Not on aspirin PTA, now on aspirin - States no change in symptoms since admission  Hypokalemia - Replace and follow.  Vestibular migraine - Recently diagnosed by outpatient neurology. Continue steroid taper and meclizine  Essential hypertension - Controlled. Continue home medications  Small left ICA cavernous segment aneurysm - Await neurology recommendations regarding follow-up.   DVT prophylaxis: Lovenox Code Status: Full Family Communication: None at bedside Disposition Plan: DC home when medically stable, possibly in 1-2 days   Consultants:  Neurology  Procedures:  None  Antibiotics:  None   Subjective: Still has numbness of left side of face, arm and leg. No reported headache.  Objective: Filed Vitals:   07/18/15 0339 07/18/15 0512 07/18/15 0659 07/18/15 1054  BP: 114/77 110/70 123/85 117/78  Pulse: 65 74 73 77  Temp: 98.2 F (36.8 C) 97.7 F (36.5 C)  97.5 F (36.4 C)  TempSrc: Oral Oral  Oral  Resp: 18 16 18 16   Height:      Weight:      SpO2:  96% 95% 93% 93%   No intake or output data in the 24 hours ending 07/18/15 1137 Filed Weights   07/17/15 2107  Weight: 83.008 kg (183 lb)     Exam:  General exam: Pleasant middle-aged female lying comfortably in bed Respiratory system: Clear. No increased work of breathing. Cardiovascular system: S1 & S2 heard, RRR. No JVD, murmurs, gallops, clicks or pedal edema. Telemetry: Sinus rhythm Gastrointestinal system: Abdomen is nondistended, soft and nontender. Normal bowel sounds heard. Central nervous system: Alert and oriented. No focal neurological deficits. Extremities: Symmetric 5 x 5 power.   Data Reviewed: Basic Metabolic Panel:  Recent Labs Lab 07/17/15 2123 07/17/15 2311 07/18/15 0520  NA 136 137 136  K 3.8 3.9 3.4*  CL 102 103 101  CO2 23  --  24  GLUCOSE 150* 127* 178*  BUN 16 17 15   CREATININE 0.86 0.70 0.88  CALCIUM 9.7  --  9.3   Liver Function Tests:  Recent Labs Lab 07/18/15 0520  AST 29  ALT 44  ALKPHOS 48  BILITOT 0.7  PROT 6.4*  ALBUMIN 3.8   No results for input(s): LIPASE, AMYLASE in the last 168 hours. No results for input(s): AMMONIA in the last 168 hours. CBC:  Recent Labs Lab 07/17/15 2123 07/17/15 2311 07/18/15 0520  WBC 10.5  --  12.3*  NEUTROABS 7.8*  --   --   HGB 14.5 15.0 13.0  HCT 42.5 44.0 39.6  MCV 89.3  --  90.6  PLT 351  --  292   Cardiac Enzymes: No results for input(s): CKTOTAL, CKMB, CKMBINDEX,  TROPONINI in the last 168 hours. BNP (last 3 results) No results for input(s): PROBNP in the last 8760 hours. CBG: No results for input(s): GLUCAP in the last 168 hours.  No results found for this or any previous visit (from the past 240 hour(s)).       Studies: Dg Chest 2 View  07/17/2015  CLINICAL DATA:  Chest pain and heart palpitations. Left arm is numb and tingling. Symptoms began after patient took prednisone. EXAM: CHEST  2 VIEW COMPARISON:  None. FINDINGS: Normal heart size and pulmonary vascularity. No  focal airspace disease or consolidation in the lungs. No blunting of costophrenic angles. No pneumothorax. Mediastinal contours appear intact. Postoperative changes in the right shoulder. IMPRESSION: No active cardiopulmonary disease. Electronically Signed   By: Burman Nieves M.D.   On: 07/17/2015 21:46   Ct Head Wo Contrast  07/17/2015  CLINICAL DATA:  Left-sided arm and facial numbness. Onset today at 1930 hours. Code stroke. EXAM: CT HEAD WITHOUT CONTRAST TECHNIQUE: Contiguous axial images were obtained from the base of the skull through the vertex without intravenous contrast. COMPARISON:  None. FINDINGS: Ventricles and sulci appear symmetrical. No ventricular dilatation. No mass effect or midline shift. No abnormal extra-axial fluid collections. Gray-white matter junctions are distinct. Basal cisterns are not effaced. No evidence of acute intracranial hemorrhage. No depressed skull fractures. Mild mucosal thickening in the paranasal sinuses. Loss of septation in the ethmoid air cells consistent postoperative change. Bilateral ostiomeatal windows interpreted tectum use. No acute air-fluid levels in the paranasal sinuses. Mastoid air cells are not opacified. Congenital nonunion of the posterior arch of C1. IMPRESSION: No acute intracranial abnormalities. These results were called by telephone at the time of interpretation on 07/17/2015 at 11:21 pm to Dr. Alvira Monday , who verbally acknowledged these results. Electronically Signed   By: Burman Nieves M.D.   On: 07/17/2015 23:23   Mr Brain Wo Contrast  07/18/2015  CLINICAL DATA:  54 year old female with left side numbness, tingling, vertigo. Code stroke presentation on 07/17/2015. Initial encounter. EXAM: MRI HEAD WITHOUT CONTRAST MRA HEAD WITHOUT CONTRAST TECHNIQUE: Multiplanar, multiecho pulse sequences of the brain and surrounding structures were obtained without intravenous contrast. Angiographic images of the head were obtained using MRA  technique without contrast. COMPARISON:  Head CT without contrast 07/17/2015 FINDINGS: MRI HEAD FINDINGS Cerebral volume is normal. No restricted diffusion to suggest acute infarction. No midline shift, mass effect, evidence of mass lesion, ventriculomegaly, extra-axial collection or acute intracranial hemorrhage. Cervicomedullary junction and pituitary are within normal limits. Major intracranial vascular flow voids are within normal limits. Wallace Cullens and white matter signal is within normal limits for age throughout the brain. Visible internal auditory structures appear normal. Mastoids are clear. Previous paranasal sinus surgery. Mild residual sinus mucosal thickening. Negative orbit and scalp soft tissues. Negative visualized cervical spine. Visualized bone marrow signal is within normal limits. MRA HEAD FINDINGS Antegrade flow in the posterior circulation with fairly codominant distal vertebral arteries. No distal vertebral artery stenosis. Normal PICA origins. Normal vertebrobasilar junction. No basilar stenosis. SCA and PCA origins are normal. Both posterior communicating arteries are present. Bilateral PCA branches are within normal limits. Antegrade flow in both ICA siphons. No siphon stenosis. However, there is a 2-3 mm saccular lesion arising in the distal cavernous siphon directed superiorly towards the supraclinoid segment (series 5, image 96 and series 506, image 13). This is proximal to the anterior genu. Both ophthalmic artery origins are normal. Supraclinoid ICA segments appear normal. Normal carotid termini, MCA  and ACA origins. Anterior communicating artery and visualized ACA branches are within normal limits. Bilateral MCA M1 segments and visualized MCA branches are within normal limits. IMPRESSION: 1.  No acute intracranial abnormality. 2. 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 This appears to be extradural, within the cavernous sinus, and as such should not be a risk factor for  subarachnoid hemorrhage. Neuroendovascular consultation recommended. 3. Otherwise normal intracranial MRA and noncontrast MRI appearance of the brain. Electronically Signed   By: Odessa Fleming M.D.   On: 07/18/2015 07:00   Mr Maxine Glenn Head/brain Wo Cm  07/18/2015  CLINICAL DATA:  54 year old female with left side numbness, tingling, vertigo. Code stroke presentation on 07/17/2015. Initial encounter. EXAM: MRI HEAD WITHOUT CONTRAST MRA HEAD WITHOUT CONTRAST TECHNIQUE: Multiplanar, multiecho pulse sequences of the brain and surrounding structures were obtained without intravenous contrast. Angiographic images of the head were obtained using MRA technique without contrast. COMPARISON:  Head CT without contrast 07/17/2015 FINDINGS: MRI HEAD FINDINGS Cerebral volume is normal. No restricted diffusion to suggest acute infarction. No midline shift, mass effect, evidence of mass lesion, ventriculomegaly, extra-axial collection or acute intracranial hemorrhage. Cervicomedullary junction and pituitary are within normal limits. Major intracranial vascular flow voids are within normal limits. Wallace Cullens and white matter signal is within normal limits for age throughout the brain. Visible internal auditory structures appear normal. Mastoids are clear. Previous paranasal sinus surgery. Mild residual sinus mucosal thickening. Negative orbit and scalp soft tissues. Negative visualized cervical spine. Visualized bone marrow signal is within normal limits. MRA HEAD FINDINGS Antegrade flow in the posterior circulation with fairly codominant distal vertebral arteries. No distal vertebral artery stenosis. Normal PICA origins. Normal vertebrobasilar junction. No basilar stenosis. SCA and PCA origins are normal. Both posterior communicating arteries are present. Bilateral PCA branches are within normal limits. Antegrade flow in both ICA siphons. No siphon stenosis. However, there is a 2-3 mm saccular lesion arising in the distal cavernous siphon  directed superiorly towards the supraclinoid segment (series 5, image 96 and series 506, image 13). This is proximal to the anterior genu. Both ophthalmic artery origins are normal. Supraclinoid ICA segments appear normal. Normal carotid termini, MCA and ACA origins. Anterior communicating artery and visualized ACA branches are within normal limits. Bilateral MCA M1 segments and visualized MCA branches are within normal limits. IMPRESSION: 1.  No acute intracranial abnormality. 2. 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 This appears to be extradural, within the cavernous sinus, and as such should not be a risk factor for subarachnoid hemorrhage. Neuroendovascular consultation recommended. 3. Otherwise normal intracranial MRA and noncontrast MRI appearance of the brain. Electronically Signed   By: Odessa Fleming M.D.   On: 07/18/2015 07:00        Scheduled Meds: . amitriptyline  25 mg Oral QHS  . aspirin  300 mg Rectal Daily   Or  . aspirin  325 mg Oral Daily  . enoxaparin (LOVENOX) injection  40 mg Subcutaneous Q24H  . lisinopril  10 mg Oral Daily   And  . hydrochlorothiazide  12.5 mg Oral Daily  . loratadine  10 mg Oral Daily  . pantoprazole  40 mg Oral Daily  . [START ON 07/19/2015] predniSONE  40 mg Oral Q breakfast   Followed by  . [START ON 07/20/2015] predniSONE  30 mg Oral Q breakfast   Followed by  . [START ON 07/21/2015] predniSONE  20 mg Oral Q breakfast   Followed by  . [START ON 07/22/2015] predniSONE  10  mg Oral Q breakfast   Continuous Infusions: . sodium chloride 75 mL/hr at 07/18/15 0502    Principal Problem:   Tingling of left arm and left side of face Active Problems:   Vestibular migraine   Essential hypertension    Time spent: 25 minutes.    Marcellus Scott, MD, FACP, FHM. [facility] Pager [phone]  If 7PM-7AM, please contact night-coverage [email or web address] Password [email or web address] 07/18/2015, 11:37 AM    LOS: 0 days

## 2015-07-18 NOTE — ED Notes (Signed)
[facility]. [facility]. She is unsure what time this started and states the tingling started in her finger tips this morning and gradually progressed up to the left side of her face. Pt A&OX4, speaking in clear complete sentences, no facial droop but still reporting current tingling to left arm and side of face.

## 2015-07-18 NOTE — Progress Notes (Addendum)
Preliminary on Carotid Duplex: Carotid Duplex has been completed.Preliminary findings report Bilaterally vertebral indicates antegrade flow. No evidence of stenosis in the ICA's bilaterally. tw

## 2015-07-18 NOTE — Progress Notes (Signed)
[facility] 54 [facility] presenting with migraine headache and new onset numbness/tingling in left arm and face with vertigo. LSN 1930 this eveing. Pertinent PMH includes HTN and Migraines.  CT completed and [facility]. Per Neurologist Dr. Roseanne RenoStewart CT scan negative. NIHSS completed yielding 0, however Pt complains of persistent numbness and tingling. Pt for admit to hospital for stroke workup and MRI/MRA.

## 2015-07-19 DIAGNOSIS — I10 Essential (primary) hypertension: Secondary | ICD-10-CM | POA: Diagnosis not present

## 2015-07-19 DIAGNOSIS — G43109 Migraine with aura, not intractable, without status migrainosus: Secondary | ICD-10-CM | POA: Diagnosis not present

## 2015-07-19 LAB — BASIC METABOLIC PANEL
Anion gap: 7 (ref 5–15)
BUN: 15 mg/dL (ref 6–20)
CALCIUM: 9.3 mg/dL (ref 8.9–10.3)
CHLORIDE: 104 mmol/L (ref 101–111)
CO2: 28 mmol/L (ref 22–32)
CREATININE: 0.88 mg/dL (ref 0.44–1.00)
GFR calc non Af Amer: >60 mL/min (ref >60)
GLUCOSE: 97 mg/dL (ref 65–99)
Potassium: 3.7 mmol/L (ref 3.5–5.1)
Sodium: 139 mmol/L (ref 135–145)

## 2015-07-19 MED ORDER — ATORVASTATIN CALCIUM 20 MG PO TABS: 20.0000 mg | ORAL_TABLET | Freq: Every day | ORAL | Status: DC

## 2015-07-19 MED ORDER — ASPIRIN EC 81 MG PO TBEC: 81.0000 mg | DELAYED_RELEASE_TABLET | Freq: Every day | ORAL | Status: AC

## 2015-07-19 NOTE — Progress Notes (Signed)
OT Cancellation Note  Patient Details Name: Christy Clayton MRN: [id number] DOB: [birth year]   Cancelled Treatment:    Reason Eval/Treat Not Completed: OT screened, no needs identified, will sign off -- Patient reports symptoms have resolved and she has taken a shower, dressed, and groomed. Reports, "I'm just bored now." No OT needs identified. Will sign off.  [name] A 07/19/2015, 10:06 AM

## 2015-07-19 NOTE — Progress Notes (Signed)
SLP Cancellation Note  Patient Details Name: Tawanna SatSally Uselton MRN: [id number] DOB: 02/04/1961   Cancelled treatment:       Reason Eval/Treat Not Completed: SLP screened, no needs identified, will sign off.  Pt endorses no changes in speech, language or cognitive function, tolerating current diet.  No formal evaluation indicated at this time.     Tranika Scholler, Melanee SpryNicole L 07/19/2015, 1:55 PM

## 2015-07-19 NOTE — Progress Notes (Signed)
Pt discharged from hospital per MD orders. Pt educated on discharge instructions. Pt verbalized understanding of instructions. IV and telemetry were removed by RN before discharge. Pt was able to ambulate to exit hospital with observation by RN. All questions and concerns were answered.

## 2015-07-19 NOTE — Progress Notes (Signed)
PT Cancellation Note  Patient Details Name: Christy Clayton MRN: [id number] DOB: [birth year]   Cancelled Treatment:    Reason Eval/Treat Not Completed: PT screened, no needs identified, will sign off. Pt standing up at sink fixing hair, bending over, ambulating in room.  Discussed PT order with pt.  No skilled PT needs identified. Pt denies weakness or numbness at this time.  "I think it was just because of a migraine."  Will sign off at this time. Thank you for the referral. Clydie BraunKaren L. Katrinka BlazingSmith, [place] Pager [phone] 07/19/2015    Christy Clayton 07/19/2015, 9:06 AM

## 2015-07-19 NOTE — Discharge Instructions (Signed)
Migraine Headache A migraine headache is an intense, throbbing pain on one or both sides of your head. A migraine can last for 30 minutes to several hours. CAUSES  The exact cause of a migraine headache is not always known. However, a migraine may be caused when nerves in the brain become irritated and release chemicals that cause inflammation. This causes pain. Certain things may also trigger migraines, such as:  Alcohol.  Smoking.  Stress.  Menstruation.  Aged cheeses.  Foods or drinks that contain nitrates, glutamate, aspartame, or tyramine.  Lack of sleep.  Chocolate.  Caffeine.  Hunger.  Physical exertion.  Fatigue.  Medicines used to treat chest pain (nitroglycerine), birth control pills, estrogen, and some blood pressure medicines. SIGNS AND SYMPTOMS  Pain on one or both sides of your head.  Pulsating or throbbing pain.  Severe pain that prevents daily activities.  Pain that is aggravated by any physical activity.  Nausea, vomiting, or both.  Dizziness.  Pain with exposure to bright lights, loud noises, or activity.  General sensitivity to bright lights, loud noises, or smells. Before you get a migraine, you may get warning signs that a migraine is coming (aura). An aura may include:  Seeing flashing lights.  Seeing bright spots, halos, or zigzag lines.  Having tunnel vision or blurred vision.  Having feelings of numbness or tingling.  Having trouble talking.  Having muscle weakness. DIAGNOSIS  A migraine headache is often diagnosed based on:  Symptoms.  Physical exam.  A CT scan or MRI of your head. These imaging tests cannot diagnose migraines, but they can help rule out other causes of headaches. TREATMENT Medicines may be given for pain and nausea. Medicines can also be given to help prevent recurrent migraines.  HOME CARE INSTRUCTIONS  Only take over-the-counter or prescription medicines for pain or discomfort as directed by your  health care provider. The use of long-term narcotics is not recommended.  Lie down in a dark, quiet room when you have a migraine.  Keep a journal to find out what may trigger your migraine headaches. For example, write down:  What you eat and drink.  How much sleep you get.  Any change to your diet or medicines.  Limit alcohol consumption.  Quit smoking if you smoke.  Get 7-9 hours of sleep, or as recommended by your health care provider.  Limit stress.  Keep lights dim if bright lights bother you and make your migraines worse. SEEK IMMEDIATE MEDICAL CARE IF:   Your migraine becomes severe.  You have a fever.  You have a stiff neck.  You have vision loss.  You have muscular weakness or loss of muscle control.  You start losing your balance or have trouble walking.  You feel faint or pass out.  You have severe symptoms that are different from your first symptoms. MAKE SURE YOU:   Understand these instructions.  Will watch your condition.  Will get help right away if you are not doing well or get worse.   This information is not intended to replace advice given to you by your health care provider. Make sure you discuss any questions you have with your health care provider.   Document Released: 07/20/2005 Document Revised: 08/10/2014 Document Reviewed: 03/27/2013 Elsevier Interactive Patient Education 2016 [employer]  Transient Ischemic Attack A transient ischemic attack (TIA) is a "warning stroke" that causes stroke-like symptoms. Unlike a stroke, a TIA does not cause permanent damage to the brain. The symptoms of a TIA  can happen very fast and do not last long. It is important to know the symptoms of a TIA and what to do. This can help prevent a major stroke or death. CAUSES  A TIA is caused by a temporary blockage in an artery in the brain or neck (carotid artery). The blockage does not allow the brain to get the blood supply it needs and can cause different  symptoms. The blockage can be caused by either:  A blood clot.  Fatty buildup (plaque) in a neck or brain artery. RISK FACTORS  High blood pressure (hypertension).  High cholesterol.  Diabetes mellitus.  Heart disease.  The buildup of plaque in the blood vessels (peripheral artery disease or atherosclerosis).  The buildup of plaque in the blood vessels that provide blood and oxygen to the brain (carotid artery stenosis).  An abnormal heart rhythm (atrial fibrillation).  Obesity.  Using any tobacco products, including cigarettes, chewing tobacco, or electronic cigarettes.  Taking oral contraceptives, especially in combination with using tobacco.  Physical inactivity.  A diet high in fats, salt (sodium), and calories.  Excessive alcohol use.  Use of illegal drugs (especially cocaine and methamphetamine).  Being female.  Being African American.  Being over the age of 73 years.  Family history of stroke.  Previous history of blood clots, stroke, TIA, or heart attack.  Sickle cell disease. SIGNS AND SYMPTOMS  TIA symptoms are the same as a stroke but are temporary. These symptoms usually develop suddenly, or may be newly present upon waking from sleep:  Sudden weakness or numbness of the face, arm, or leg, especially on one side of the body.  Sudden trouble walking or difficulty moving arms or legs.  Sudden confusion.  Sudden personality changes.  Trouble speaking (aphasia) or understanding.  Difficulty swallowing.  Sudden trouble seeing in one or both eyes.  Double vision.  Dizziness.  Loss of balance or coordination.  Sudden severe headache with no known cause.  Trouble reading or writing.  Loss of bowel or bladder control.  Loss of consciousness. DIAGNOSIS  Your health care provider may be able to determine the presence or absence of a TIA based on your symptoms, history, and physical exam. CT scan of the brain is usually performed to help  identify a TIA. Other tests may include:  Electrocardiography (ECG).  Continuous heart monitoring.  Echocardiography.  Carotid ultrasonography.  MRI.  A scan of the brain circulation.  Blood tests. TREATMENT  Since the symptoms of TIA are the same as a stroke, it is important to seek treatment as soon as possible. You may need a medicine to dissolve a blood clot (thrombolytic) if that is the cause of the TIA. This medicine cannot be given if too much time has passed. Treatment may also include:   Rest, oxygen, fluids through an IV tube, and medicines to thin the blood (anticoagulants).  Measures will be taken to prevent short-term and long-term complications, including infection from breathing foreign material into the lungs (aspiration pneumonia), blood clots in the legs, and falls.  Procedures to either remove plaque in the carotid arteries or dilate carotid arteries that have narrowed due to plaque. Those procedures are:  Carotid endarterectomy.  Carotid angioplasty and stenting.  Medicines and diet may be used to address diabetes, high blood pressure, and other underlying risk factors. HOME CARE INSTRUCTIONS   Take medicines only as directed by your health care provider. Follow the directions carefully. Medicines may be used to control risk factors for a  stroke. Be sure you understand all your medicine instructions.  You may be told to take aspirin or the anticoagulant warfarin. Warfarin needs to be taken exactly as instructed.  Taking too much or too little warfarin is dangerous. Too much warfarin increases the risk of bleeding. Too little warfarin continues to allow the risk for blood clots. While taking warfarin, you will need to have regular blood tests to measure your blood clotting time. A PT blood test measures how long it takes for blood to clot. Your PT is used to calculate another value called an INR. Your PT and INR help your health care provider to adjust your dose  of warfarin. The dose can change for many reasons. It is critically important that you take warfarin exactly as prescribed.  Many foods, especially foods high in vitamin K can interfere with warfarin and affect the PT and INR. Foods high in vitamin K include spinach, kale, broccoli, cabbage, collard and turnip greens, Brussels sprouts, peas, cauliflower, seaweed, and parsley, as well as beef and pork liver, green tea, and soybean oil. You should eat a consistent amount of foods high in vitamin K. Avoid major changes in your diet, or notify your health care provider before changing your diet. Arrange a visit with a dietitian to answer your questions.  Many medicines can interfere with warfarin and affect the PT and INR. You must tell your health care provider about any and all medicines you take; this includes all vitamins and supplements. Be especially cautious with aspirin and anti-inflammatory medicines. Do not take or discontinue any prescribed or over-the-counter medicine except on the advice of your health care provider or pharmacist.  Warfarin can have side effects, such as excessive bruising or bleeding. You will need to hold pressure over cuts for longer than usual. Your health care provider or pharmacist will discuss other potential side effects.  Avoid sports or activities that may cause injury or bleeding.  Be careful when shaving, flossing your teeth, or handling sharp objects.  Alcohol can change the body's ability to handle warfarin. It is best to avoid alcoholic drinks or consume only very small amounts while taking warfarin. Notify your health care provider if you change your alcohol intake.  Notify your dentist or other health care providers before procedures.  Eat a diet that includes 5 or more servings of fruits and vegetables each day. This may reduce the risk of stroke. Certain diets may be prescribed to address high blood pressure, high cholesterol, diabetes, or obesity.  A  diet low in sodium, saturated fat, trans fat, and cholesterol is recommended to manage high blood pressure.  A diet low in saturated fat, trans fat, and cholesterol, and high in fiber may control cholesterol levels.  A controlled-carbohydrate, controlled-sugar diet is recommended to manage diabetes.  A reduced-calorie diet that is low in sodium, saturated fat, trans fat, and cholesterol is recommended to manage obesity.  Maintain a healthy weight.  Stay physically active. It is recommended that you get at least 30 minutes of activity on most or all days.  Do not use any tobacco products, including cigarettes, chewing tobacco, or electronic cigarettes. If you need help quitting, ask your health care provider.  Limit alcohol intake to no more than 1 drink per day for nonpregnant women and 2 drinks per day for men. One drink equals 12 ounces of beer, 5 ounces of wine, or 1 ounces of hard liquor.  Do not abuse drugs.  A safe home environment is important  to reduce the risk of falls. Your health care provider may arrange for specialists to evaluate your home. Having grab bars in the bedroom and bathroom is often important. Your health care provider may arrange for equipment to be used at home, such as raised toilets and a seat for the shower.  Follow all instructions for follow-up with your health care provider. This is very important. This includes any referrals and lab tests. Proper follow-up can prevent a stroke or another TIA from occurring. PREVENTION  The risk of a TIA can be decreased by appropriately treating high blood pressure, high cholesterol, diabetes, heart disease, and obesity, and by quitting smoking, limiting alcohol, and staying physically active. SEEK MEDICAL CARE IF:  You have personality changes.  You have difficulty swallowing.  You are seeing double.  You have dizziness.  You have a fever. SEEK IMMEDIATE MEDICAL CARE IF:  Any of the following symptoms may  represent a serious problem that is an emergency. Do not wait to see if the symptoms will go away. Get medical help right away. Call your local emergency services (911 in [place] Do not drive yourself to the hospital.  You have sudden weakness or numbness of the face, arm, or leg, especially on one side of the body.  You have sudden trouble walking or difficulty moving arms or legs.  You have sudden confusion.  You have trouble speaking (aphasia) or understanding.  You have sudden trouble seeing in one or both eyes.  You have a loss of balance or coordination.  You have a sudden, severe headache with no known cause.  You have new chest pain or an irregular heartbeat.  You have a partial or total loss of consciousness. MAKE SURE YOU:   Understand these instructions.  Will watch your condition.  Will get help right away if you are not doing well or get worse.   This information is not intended to replace advice given to you by your health care provider. Make sure you discuss any questions you have with your health care provider.   Document Released: 04/29/2005 Document Revised: 08/10/2014 Document Reviewed: 10/25/2013 [employer] Interactive Patient Education [employer]

## 2015-07-19 NOTE — Discharge Summary (Signed)
Physician Discharge Summary  Christy Clayton ZOX:[id number] DOB: Jan 15, 1961 DOA: 07/17/2015  PCP: Frederich Chick, MD  Admit date: 07/17/2015 Discharge date: 07/19/2015  Time spent: Less than 30 minutes  Recommendations for Outpatient Follow-up:  1. Dr. Shon Millet, Neurology in 2 weeks 2. Dr. Shirlean Mylar, PCP in 1 week. Please follow A1c that was sent from the hospital.  Discharge Diagnoses:  Principal Problem:   Tingling of left arm and left side of face Active Problems:   Vestibular migraine   Essential hypertension   Discharge Condition: Improved & Stable  Diet recommendation: Heart healthy diet.  Filed Weights   07/17/15 2107  Weight: 83.008 kg (183 lb)    History of present illness:  54 year old female patient with history of HTN, recently diagnosed vestibular migraine and placed on prednisone-took first dose on 07/17/15, presented to [facility] ED on 07/17/15 with numbness of left side of face and left hand that started around 7 PM. CT head negative for acute findings. Neurology was consulted and recommended admission for evaluation of stroke versus TIA versus complicated migraine.  Hospital Course:   Numbness of left face and arm - Neurology consultation appreciated. Differential diagnosis: Complicated migraine versus TIA. - MRI brain: Negative for stroke but shows small left ICA cavernous segment aneurysm which probably is an incidental finding. - MRA brain negative - CT head: No acute findings - 2-D echo: LVEF 55-60 percent. No cardiac source of embolism was identified. - Carotid Dopplers: No significant ICA stenosis. - LDL 155, goal <70. Started newly on Lipitor 20 MG daily - A1c pending. Goal <7 - Not on aspirin PTA, now on aspirin 81 MG daily - Symptoms have resolved since admission. - Neurology follow-up appreciated: Recommend aspirin 81 MG daily and not to take Frova or any triptan for abortive migraine therapy as this may place her at risk for CVA. Outpatient follow-up  with her primary neurologist. - PT did not see any needs.  Hypokalemia - Replaced  Vestibular migraine - Recently diagnosed by outpatient neurology. Continue steroid taper and meclizine  Essential hypertension - Controlled. Continue home medications  Small left ICA cavernous segment aneurysm - Probably an incidental finding and not the cause of her primary presentation. May need surveillance MRAs as outpatient for follow-up. Radiologist recommended Neuro endovascular consultation which can be coordinated by her primary neurologist if deemed necessary. Discussed with neurohospitalist team today & they have cleared her for discharge. Discussed extensively with patient and advised her to follow-up with her primary neurologist.    Consultants:  Neurology  Procedures:  2-D echo 07/18/15: Study Conclusions  - Left ventricle: The cavity size was normal. There was mild concentric hypertrophy. Systolic function was normal. The estimated ejection fraction was in the range of 55% to 60%. Wall motion was normal; there were no regional wall motion abnormalities.  Impressions:  - No cardiac source of embolism was identified, but cannot be ruled out on the basis of this examination.  Carotid Dopplers 07/18/15: Summary: No significant bilateral ICA stenosis. elevated left CCA velocities of unclear significance. Bilateral vertebral artery flow is antegrade.   Antibiotics:  None   Discharge Exam:  Complaints: States that she is back to baseline. Left sided facial and left upper extremity numbness has completely resolved. She does have intermittent left hand numbness which is chronic and attributed to carpal tunnel syndrome. She states that she never had left lower extremity symptoms. No new symptoms reported. Anxious to go home.  Filed Vitals:   07/19/15 0107 07/19/15 8119  07/19/15 0824 07/19/15 1335  BP: 108/64 109/66 108/80 123/75  Pulse: 73 62 74 71  Temp: 97.8 F  (36.6 C) 98.4 F (36.9 C) 98.4 F (36.9 C) 98.3 F (36.8 C)  TempSrc: Oral Oral Oral Oral  Resp: 18 20 18 18   Height:      Weight:      SpO2: 95% 95% 96% 98%    General exam: Pleasant middle-aged female lying comfortably in bed Respiratory system: Clear. No increased work of breathing. Cardiovascular system: S1 & S2 heard, RRR. No JVD, murmurs, gallops, clicks or pedal edema. Telemetry: Sinus rhythm Gastrointestinal system: Abdomen is nondistended, soft and nontender. Normal bowel sounds heard. Central nervous system: Alert and oriented. No focal neurological deficits. Extremities: Symmetric 5 x 5 power.  Discharge Instructions      Discharge Instructions    Activity as tolerated - No restrictions    Complete by:  As directed      Call MD for:    Complete by:  As directed   Strokelike symptoms.  Strokelike symptoms.     Diet - low sodium heart healthy    Complete by:  As directed             Medication List    STOP taking these medications        frovatriptan 2.5 MG tablet  Commonly known as:  FROVA      TAKE these medications        amitriptyline 25 MG tablet  Commonly known as:  ELAVIL  Take 25 mg by mouth at bedtime.     aspirin EC 81 MG tablet  Take 1 tablet (81 mg total) by mouth daily.     atorvastatin 20 MG tablet  Commonly known as:  LIPITOR  Take 1 tablet (20 mg total) by mouth daily.     cetirizine 10 MG tablet  Commonly known as:  ZYRTEC  Take 10 mg by mouth daily.     esomeprazole 40 MG capsule  Commonly known as:  NEXIUM  Take 40 mg by mouth daily at 12 noon.     fosinopril-hydrochlorothiazide 10-12.5 MG tablet  Commonly known as:  MONOPRIL-HCT  Take 1 tablet by mouth daily.     meclizine 25 MG tablet  Commonly known as:  ANTIVERT  Take 25 mg by mouth 3 (three) times daily as needed for dizziness.     MULTIVITAMIN WOMEN PO  Take by mouth.     ondansetron 8 MG tablet  Commonly known as:  ZOFRAN  Take by mouth every 8 (eight)  hours as needed for nausea or vomiting.     predniSONE 10 MG tablet  Commonly known as:  DELTASONE  Take 6tabs x1day, then 5tabs x1day, then 4tabs x1day, then 3tabs x1day, then 2tabs x1day, then 1tab x1day, then STOP       Follow-up Information    Follow up with Cira Servant, DO. Schedule an appointment as soon as possible for a visit in 2 weeks.   Specialty:  Neurology   Why:  For hospital follow up   Contact information:   [address] [place] [address] [phone]       Follow up with WEBB, CAROL D, MD. Schedule an appointment as soon as possible for a visit in 1 week.   Specialty:  Family Medicine   Contact information:   [address] [place] [address] [phone]        The results  of significant diagnostics from this hospitalization (including imaging, microbiology, ancillary and laboratory) are listed below for reference.    Significant Diagnostic Studies: Dg Chest 2 View  07/17/2015  CLINICAL DATA:  Chest pain and heart palpitations. Left arm is numb and tingling. Symptoms began after patient took prednisone. EXAM: CHEST  2 VIEW COMPARISON:  None. FINDINGS: Normal heart size and pulmonary vascularity. No focal airspace disease or consolidation in the lungs. No blunting of costophrenic angles. No pneumothorax. Mediastinal contours appear intact. Postoperative changes in the right shoulder. IMPRESSION: No active cardiopulmonary disease. Electronically Signed   By: Burman Nieves M.D.   On: 07/17/2015 21:46   Ct Head Wo Contrast  07/17/2015  CLINICAL DATA:  Left-sided arm and facial numbness. Onset today at 1930 hours. Code stroke. EXAM: CT HEAD WITHOUT CONTRAST TECHNIQUE: Contiguous axial images were obtained from the base of the skull through the vertex without intravenous contrast. COMPARISON:  None. FINDINGS: Ventricles and sulci appear symmetrical. No ventricular dilatation. No mass effect or midline shift. No  abnormal extra-axial fluid collections. Gray-white matter junctions are distinct. Basal cisterns are not effaced. No evidence of acute intracranial hemorrhage. No depressed skull fractures. Mild mucosal thickening in the paranasal sinuses. Loss of septation in the ethmoid air cells consistent postoperative change. Bilateral ostiomeatal windows interpreted tectum use. No acute air-fluid levels in the paranasal sinuses. Mastoid air cells are not opacified. Congenital nonunion of the posterior arch of C1. IMPRESSION: No acute intracranial abnormalities. These results were called by telephone at the time of interpretation on 07/17/2015 at 11:21 pm to Dr. Alvira Monday , who verbally acknowledged these results. Electronically Signed   By: Burman Nieves M.D.   On: 07/17/2015 23:23   Mr Brain Wo Contrast  07/18/2015  CLINICAL DATA:  54 year old female with left side numbness, tingling, vertigo. Code stroke presentation on 07/17/2015. Initial encounter. EXAM: MRI HEAD WITHOUT CONTRAST MRA HEAD WITHOUT CONTRAST TECHNIQUE: Multiplanar, multiecho pulse sequences of the brain and surrounding structures were obtained without intravenous contrast. Angiographic images of the head were obtained using MRA technique without contrast. COMPARISON:  Head CT without contrast 07/17/2015 FINDINGS: MRI HEAD FINDINGS Cerebral volume is normal. No restricted diffusion to suggest acute infarction. No midline shift, mass effect, evidence of mass lesion, ventriculomegaly, extra-axial collection or acute intracranial hemorrhage. Cervicomedullary junction and pituitary are within normal limits. Major intracranial vascular flow voids are within normal limits. Wallace Cullens and white matter signal is within normal limits for age throughout the brain. Visible internal auditory structures appear normal. Mastoids are clear. Previous paranasal sinus surgery. Mild residual sinus mucosal thickening. Negative orbit and scalp soft tissues. Negative  visualized cervical spine. Visualized bone marrow signal is within normal limits. MRA HEAD FINDINGS Antegrade flow in the posterior circulation with fairly codominant distal vertebral arteries. No distal vertebral artery stenosis. Normal PICA origins. Normal vertebrobasilar junction. No basilar stenosis. SCA and PCA origins are normal. Both posterior communicating arteries are present. Bilateral PCA branches are within normal limits. Antegrade flow in both ICA siphons. No siphon stenosis. However, there is a 2-3 mm saccular lesion arising in the distal cavernous siphon directed superiorly towards the supraclinoid segment (series 5, image 96 and series 506, image 13). This is proximal to the anterior genu. Both ophthalmic artery origins are normal. Supraclinoid ICA segments appear normal. Normal carotid termini, MCA and ACA origins. Anterior communicating artery and visualized ACA branches are within normal limits. Bilateral MCA M1 segments and visualized MCA branches are within normal limits. IMPRESSION: 1.  No  acute intracranial abnormality. 2. 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 This appears to be extradural, within the cavernous sinus, and as such should not be a risk factor for subarachnoid hemorrhage. Neuroendovascular consultation recommended. 3. Otherwise normal intracranial MRA and noncontrast MRI appearance of the brain. Electronically Signed   By: Odessa FlemingH  Hall M.D.   On: 07/18/2015 07:00   Mr Maxine GlennMra Head/brain Wo Cm  07/18/2015  CLINICAL DATA:  54 year old female with left side numbness, tingling, vertigo. Code stroke presentation on 07/17/2015. Initial encounter. EXAM: MRI HEAD WITHOUT CONTRAST MRA HEAD WITHOUT CONTRAST TECHNIQUE: Multiplanar, multiecho pulse sequences of the brain and surrounding structures were obtained without intravenous contrast. Angiographic images of the head were obtained using MRA technique without contrast. COMPARISON:  Head CT without contrast 07/17/2015 FINDINGS: MRI  HEAD FINDINGS Cerebral volume is normal. No restricted diffusion to suggest acute infarction. No midline shift, mass effect, evidence of mass lesion, ventriculomegaly, extra-axial collection or acute intracranial hemorrhage. Cervicomedullary junction and pituitary are within normal limits. Major intracranial vascular flow voids are within normal limits. Wallace CullensGray and white matter signal is within normal limits for age throughout the brain. Visible internal auditory structures appear normal. Mastoids are clear. Previous paranasal sinus surgery. Mild residual sinus mucosal thickening. Negative orbit and scalp soft tissues. Negative visualized cervical spine. Visualized bone marrow signal is within normal limits. MRA HEAD FINDINGS Antegrade flow in the posterior circulation with fairly codominant distal vertebral arteries. No distal vertebral artery stenosis. Normal PICA origins. Normal vertebrobasilar junction. No basilar stenosis. SCA and PCA origins are normal. Both posterior communicating arteries are present. Bilateral PCA branches are within normal limits. Antegrade flow in both ICA siphons. No siphon stenosis. However, there is a 2-3 mm saccular lesion arising in the distal cavernous siphon directed superiorly towards the supraclinoid segment (series 5, image 96 and series 506, image 13). This is proximal to the anterior genu. Both ophthalmic artery origins are normal. Supraclinoid ICA segments appear normal. Normal carotid termini, MCA and ACA origins. Anterior communicating artery and visualized ACA branches are within normal limits. Bilateral MCA M1 segments and visualized MCA branches are within normal limits. IMPRESSION: 1.  No acute intracranial abnormality. 2. 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 This appears to be extradural, within the cavernous sinus, and as such should not be a risk factor for subarachnoid hemorrhage. Neuroendovascular consultation recommended. 3. Otherwise normal intracranial  MRA and noncontrast MRI appearance of the brain. Electronically Signed   By: Odessa FlemingH  Hall M.D.   On: 07/18/2015 07:00    Microbiology: No results found for this or any previous visit (from the past 240 hour(s)).   Labs: Basic Metabolic Panel:  Recent Labs Lab 07/17/15 2123 07/17/15 2311 07/18/15 0520 07/19/15 0756  NA 136 137 136 139  K 3.8 3.9 3.4* 3.7  CL 102 103 101 104  CO2 23  --  24 28  GLUCOSE 150* 127* 178* 97  BUN 16 17 15 15   CREATININE 0.86 0.70 0.88 0.88  CALCIUM 9.7  --  9.3 9.3   Liver Function Tests:  Recent Labs Lab 07/18/15 0520  AST 29  ALT 44  ALKPHOS 48  BILITOT 0.7  PROT 6.4*  ALBUMIN 3.8   No results for input(s): LIPASE, AMYLASE in the last 168 hours. No results for input(s): AMMONIA in the last 168 hours. CBC:  Recent Labs Lab 07/17/15 2123 07/17/15 2311 07/18/15 0520  WBC 10.5  --  12.3*  NEUTROABS 7.8*  --   --  HGB 14.5 15.0 13.0  HCT 42.5 44.0 39.6  MCV 89.3  --  90.6  PLT 351  --  292   Cardiac Enzymes: No results for input(s): CKTOTAL, CKMB, CKMBINDEX, TROPONINI in the last 168 hours. BNP: BNP (last 3 results) No results for input(s): BNP in the last 8760 hours.  ProBNP (last 3 results) No results for input(s): PROBNP in the last 8760 hours.  CBG: No results for input(s): GLUCAP in the last 168 hours.      Signed:  Marcellus Scott, MD, FACP, FHM. [facility] Pager [phone]  If 7PM-7AM, please contact night-coverage [email or web address] Password [email or web address] 07/19/2015, 2:53 PM

## 2015-07-23 LAB — HEMOGLOBIN A1C
HEMOGLOBIN A1C: 6.2 % — AB (ref 4.8–5.6)
MEAN PLASMA GLUCOSE: 131 mg/dL

## 2015-07-24 ENCOUNTER — Encounter: Payer: Self-pay | Admitting: Neurology

## 2015-07-24 ENCOUNTER — Ambulatory Visit (INDEPENDENT_AMBULATORY_CARE_PROVIDER_SITE_OTHER): Payer: BLUE CROSS/BLUE SHIELD | Admitting: Neurology

## 2015-07-24 ENCOUNTER — Other Ambulatory Visit (INDEPENDENT_AMBULATORY_CARE_PROVIDER_SITE_OTHER): Payer: BLUE CROSS/BLUE SHIELD

## 2015-07-24 VITALS — BP 116/74 | HR 103 | Ht 66.0 in | Wt 185.0 lb

## 2015-07-24 DIAGNOSIS — R42 Dizziness and giddiness: Secondary | ICD-10-CM

## 2015-07-24 DIAGNOSIS — I671 Cerebral aneurysm, nonruptured: Secondary | ICD-10-CM

## 2015-07-24 DIAGNOSIS — R202 Paresthesia of skin: Secondary | ICD-10-CM

## 2015-07-24 DIAGNOSIS — G43109 Migraine with aura, not intractable, without status migrainosus: Secondary | ICD-10-CM

## 2015-07-24 DIAGNOSIS — R200 Anesthesia of skin: Secondary | ICD-10-CM | POA: Diagnosis not present

## 2015-07-24 DIAGNOSIS — G43809 Other migraine, not intractable, without status migrainosus: Secondary | ICD-10-CM

## 2015-07-24 LAB — VITAMIN B12: VITAMIN B 12: 349 pg/mL (ref 211–911)

## 2015-07-24 MED ORDER — DICLOFENAC POTASSIUM(MIGRAINE) 50 MG PO PACK: PACK | ORAL | Status: DC

## 2015-07-24 NOTE — Patient Instructions (Signed)
At this point, I think the numbness is a manifestation of the migraine or possibly reaction to prednisone (but less likely).  Fortunately the MRI of the brain does not reveal a serious cause.  We will check ANA, Sed Rate, B12 I would continue the aspirin for now We will refer you to endovascular surgery for evaluation of the aneurysm.  The aneurysm is an incidental finding and likely will only need to be monitored. Refer for vestibular rehab Follow up in 4- 6 weeks.  Call in 2 weeks with update

## 2015-07-24 NOTE — Progress Notes (Addendum)
NEUROLOGY FOLLOW UP OFFICE NOTE  Christy Clayton [id number]  HISTORY OF PRESENT ILLNESS: Christy Clayton is a 54 year old right-handed female with vestibular migraine who follows up for recent hospital admission for left sided numbness.  History obtained by patient and hospital notes.  Labs and imaging of brain MRI and MRA reviewed.  UPDATE: On 07/18/15, she was [facility] for onset of numbness and tingling involving the face, arm and leg.  It started the day after taking the first dose of prednisone.  CT of head was negative for acute intracranial process.  MRI of the brain was normal.  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  She underwent TIA workup.  Carotid doppler showed no hemodynamically significant ICA stenosis.  Echo showed EF of 55-60% with no cardiac source of embolus.  LDL was 155.  Telemetry was negative for A fib.  She was discharged on ASA 81mg  daily and Lipitor 20mg  daily.  She was advised not to take triptans.  She continues to have numbness and tingling and she feels that the paresthesia in the upper left leg is worse.  She was advised to continue the prednisone taper but paresthesia and dizziness persists.  HISTORY: She has had migraines "all of my life."  They start in the back of her neck and radiate to the left side of her head and face.  It is a 10/10 throbbing pain.  They are associated with blurred vision, nausea, vomiting, photophobia and phonophobia.  They last several hours without medication but abort in 2 hours after two dose of Frova.  They occur 4-5 times a year.  There are no particular triggers.  Her father has migraines.  Over the past 5 years, she has experienced vertigo and dizziness following some migraines.  It seems to occur in the autumn or spring.  She initially feels spinning sensation which becomes a persistent lightheadedness.  She continues to feel nauseous.  They usually last a week but last autumn and  currently, it has persisted for 5 weeks.  Last year, she was out of work for a month.  Her last migraine was 5 weeks ago, and she continues to have persistent dizziness.  She has seen ENT in the past with unremarkable evaluation.  Current abortive medication:  Frova, meclizine 25mg , Zofran 8mg  Current preventative medication:  Amitriptyline 25mg  (just increased from 10mg  on 07/04/15).  Past abortive medication:  Imitrex. Unable to use injections due to anxiety.  ADDENDUM:   Multiple NSAIDs such as ibuprofen and naproxen.   Past preventative medication:  none  PAST MEDICAL HISTORY: Past Medical History  Diagnosis Date  . Hypertension   . Migraines   . Environmental allergies     MEDICATIONS: Current Outpatient Prescriptions on File Prior to Visit  Medication Sig Dispense Refill  . amitriptyline (ELAVIL) 25 MG tablet Take 25 mg by mouth at bedtime.    Marland Kitchen. aspirin EC 81 MG tablet Take 1 tablet (81 mg total) by mouth daily. 30 tablet 0  . atorvastatin (LIPITOR) 20 MG tablet Take 1 tablet (20 mg total) by mouth daily. 30 tablet 0  . cetirizine (ZYRTEC) 10 MG tablet Take 10 mg by mouth daily.    Marland Kitchen. esomeprazole (NEXIUM) 40 MG capsule Take 40 mg by mouth daily at 12 noon.    . fosinopril-hydrochlorothiazide (MONOPRIL-HCT) 10-12.5 MG tablet Take 1 tablet by mouth daily.    . meclizine (ANTIVERT) 25 MG tablet Take 25 mg by  mouth 3 (three) times daily as needed for dizziness.    . Multiple Vitamins-Minerals (MULTIVITAMIN WOMEN PO) Take by mouth.    . ondansetron (ZOFRAN) 8 MG tablet Take by mouth every 8 (eight) hours as needed for nausea or vomiting.     No current facility-administered medications on file prior to visit.    ALLERGIES: Allergies  Allergen Reactions  . Allegra [Fexofenadine] Nausea And Vomiting  . Celebrex [Celecoxib] Nausea And Vomiting  . Codeine Nausea And Vomiting  . Neurontin [Gabapentin] Nausea And Vomiting  . Oxycodone Nausea And Vomiting  . Sulfa Antibiotics Nausea  And Vomiting  . Cortaid [Hydrocortisone] Rash  . Promethazine Nausea And Vomiting and Rash    FAMILY HISTORY: Family History  Problem Relation Age of Onset  . Migraines Father   . Stroke Paternal Grandmother     SOCIAL HISTORY: Social History   Social History  . Marital Status: Married    Spouse Name: N/A  . Number of Children: N/A  . Years of Education: N/A   Occupational History  . Not on file.   Social History Main Topics  . Smoking status: Former Games developer  . Smokeless tobacco: Not on file  . Alcohol Use: 0.0 oz/week    0 Standard drinks or equivalent per week     Comment: occ  . Drug Use: No  . Sexual Activity: Not on file   Other Topics Concern  . Not on file   Social History Narrative    REVIEW OF SYSTEMS: Constitutional: No fevers, chills, or sweats, no generalized fatigue, change in appetite Eyes: No visual changes, double vision, eye pain Ear, nose and throat: No hearing loss, ear pain, nasal congestion, sore throat Cardiovascular: No chest pain, palpitations Respiratory:  No shortness of breath at rest or with exertion, wheezes GastrointestinaI: No nausea, vomiting, diarrhea, abdominal pain, fecal incontinence Genitourinary:  No dysuria, urinary retention or frequency Musculoskeletal:  No neck pain, back pain Integumentary: No rash, pruritus, skin lesions Neurological: as above Psychiatric: No depression, insomnia, anxiety Endocrine: No palpitations, fatigue, diaphoresis, mood swings, change in appetite, change in weight, increased thirst Hematologic/Lymphatic:  No anemia, purpura, petechiae. Allergic/Immunologic: no itchy/runny eyes, nasal congestion, recent allergic reactions, rashes  PHYSICAL EXAM: Filed Vitals:   07/24/15 1410  BP: 116/74  Pulse: 103   General: No acute distress.  Patient appears well-groomed.   Head:  Normocephalic/atraumatic Eyes:  Fundoscopic exam unremarkable without vessel changes, exudates, hemorrhages or  papilledema. Neck: supple, no paraspinal tenderness, full range of motion Heart:  Regular rate and rhythm Lungs:  Clear to auscultation bilaterally Back: No paraspinal tenderness Neurological Exam: alert and oriented to person, place, and time. Attention span and concentration intact, recent and remote memory intact, fund of knowledge intact.  Speech fluent and not dysarthric, language intact.  Decreased sensation of left V1-V2 distribution, including the neck and back of head.  Otherwise, CN II-XII intact. Fundoscopic exam unremarkable without vessel changes, exudates, hemorrhages or papilledema.  Bulk and tone normal, muscle strength 5-/5 in left upper extremity but 5/5 with full effort.  Otherwise, 5/5.  Sensation pinprick decreased in the left upper extremity, vibration intact.  Deep tendon reflexes 2+ throughout, toes downgoing.  Finger to nose and heel to shin testing intact.  Gait normal, Romberg negative.  IMPRESSION: Left sided numbness and tingling.  Unclear etiology.  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  No intracranial etiology seen.  Does not appear to be peripheral  in nature and does not exhibit myelopathic signs or symptoms. Persistent vertigo following migraine  Small left ICA cavernous segment aneurysm  PLAN: We will check ANA, Sed Rate, B12 I would continue the aspirin for now We will refer you to endovascular surgery for evaluation of the aneurysm.  The aneurysm is an incidental finding and likely will only need to be monitored. Refer for vestibular rehab Continue ASA  daily for now.  Due to stroke-like symptoms, cannot take triptans.  She has failed multiple oral NSAIDs.  Instead, will try Cambia Follow up in 4 to 6 weeks  Ms. Palmero is understandably distraught with these persistent dizziness and new unilateral paresthesia and numbness.  I reminded her that we ruled out potentially serious etiologies and  that I will continue to look for other possible causes.  Shon Millet, DO  CC:  Shirlean Mylar, MD

## 2015-07-24 NOTE — Progress Notes (Signed)
Chart forwarded.  

## 2015-07-25 LAB — SEDIMENTATION RATE: SED RATE: 14 mm/h (ref 0–22)

## 2015-07-25 LAB — ANA: Anti Nuclear Antibody(ANA): NEGATIVE

## 2015-07-30 ENCOUNTER — Telehealth: Payer: Self-pay

## 2015-07-30 ENCOUNTER — Other Ambulatory Visit: Payer: BLUE CROSS/BLUE SHIELD

## 2015-07-30 NOTE — Telephone Encounter (Signed)
Left message on machine for pt to return call to the office.  

## 2015-07-30 NOTE — Telephone Encounter (Signed)
-----   Message from Drema DallasAdam R Jaffe, DO sent at 07/25/2015  7:19 PM EST ----- Labs are normal.

## 2015-08-01 ENCOUNTER — Telehealth: Payer: Self-pay

## 2015-08-01 NOTE — Telephone Encounter (Signed)
Spoke with patient about NSAID use. States she has tried Naproxen, Advil, Aleve, ibprophen, "pretty much everything OTC"

## 2015-08-01 NOTE — Telephone Encounter (Signed)
Clinical notes fax[facility] to [facility] @ [phone]. Reference # [id number]

## 2015-08-01 NOTE — Telephone Encounter (Signed)
I made an addendum to the office note stating that she has tried multiple nsaids such as ibuprofen and naproxen.  I would like to resubmit pre-authorization for Cambia since it has been shown to be effective for migraines.

## 2015-08-08 ENCOUNTER — Ambulatory Visit: Payer: BLUE CROSS/BLUE SHIELD | Attending: Neurology

## 2015-08-08 DIAGNOSIS — R42 Dizziness and giddiness: Secondary | ICD-10-CM | POA: Diagnosis present

## 2015-08-08 DIAGNOSIS — R269 Unspecified abnormalities of gait and mobility: Secondary | ICD-10-CM | POA: Insufficient documentation

## 2015-08-08 NOTE — Therapy (Signed)
[facility] [address] [place], [place], [address] Phone: [phone]   Fax:  [phone]  Physical Therapy Evaluation  Patient Details  Name: Christy Clayton MRN: [id number] Date of Birth: August 01, 1961 Referring Provider: Dr. Everlena Cooper  Encounter Date: 08/08/2015      PT End of Session - 08/08/15 1611    Visit Number 1   Number of Visits 9   Date for PT Re-Evaluation 09/07/15   Authorization Type BCBS   PT Start Time 1401   PT Stop Time 1450   PT Time Calculation (min) 49 min   Activity Tolerance Other (comment)  limited by dizziness and nausea   Behavior During Therapy [facility] for tasks assessed/performed      Past Medical History  Diagnosis Date  . Hypertension   . Migraines   . Environmental allergies     Past Surgical History  Procedure Laterality Date  . Total abdominal hysterectomy    . Ganglion cyst excision Left     Wrist  . Lumbar fusion    . Nasal sinus surgery    . Fracture surgery      left arm  . Shoulder arthroscopy      There were no vitals filed for this visit.  Visit Diagnosis:  Dizziness and giddiness - Plan: PT plan of care cert/re-cert  Abnormality of gait - Plan: PT plan of care cert/re-cert      Subjective Assessment - 08/08/15 1410    Subjective Pt reported dizziness and nausea began after last migraine, approx. 2 months ago. Pt reports she has experience dizziness before, but this time has lasted much longer than "normal". Pt reports nausea is more severe than dizziness, especially when standing and walking around. Pt reports certain smells can incr. HA and nausea. Pt reports dizziness was a spinning sensation but now she just feels lightheadedness and vision "feels off/blurred".  Pt currently not taking BP medication, as she believes it caused facial hives.    Pertinent History Vestibular migraine, newly diagnosed ICA aneurysm-pt referred to vascular surgery to monitor, HTN   Patient  Stated Goals Get back out and doing things, "I don't like being down and out".    Currently in Pain? No/denies      DHI: 54.      [facility] PT Assessment - 08/08/15 1415    Assessment   Medical Diagnosis Vestibular migraine   Referring Provider Dr. Everlena Cooper   Onset Date/Surgical Date 06/06/15   Prior Therapy none   Precautions   Precautions Fall   Precaution Comments potential falls risk, as pt reports she becomes very lightheaded upon standing/walking and is fearful she might fall.   Restrictions   Weight Bearing Restrictions No   Balance Screen   Has the patient fallen in the past 6 months No   Has the patient had a decrease in activity level because of a fear of falling?  Yes   Is the patient reluctant to leave their home because of a fear of falling?  Yes   Home Environment   Living Environment Private residence   Living Arrangements Spouse/significant other   Available Help at Discharge Family   Type of Home Apartment   Home Access Stairs to enter   Entrance Stairs-Number of Steps 3 flights of stairs   Entrance Stairs-Rails Left   Home Layout One level   Home Equipment None   Prior Function   Level of Independence Independent   Leisure hiking    Cognition  Overall Cognitive Status Within Functional Limits for tasks assessed            Vestibular Assessment - 08/08/15 1419    Symptom Behavior   Type of Dizziness Lightheadedness   Frequency of Dizziness Every day   Duration of Dizziness Constant  nausea and dizziness a little better with sitting   Aggravating Factors Comment  standing/walking   Relieving Factors Rest  closing eyes makes it worse   Occulomotor Exam   Occulomotor Alignment Normal   Spontaneous Absent   Gaze-induced Absent   Head shaking Horizontal --  unable to perform 2/2 intense nausea/dizziness during VOR   Smooth Pursuits Comment  slowed trajectory 2/2 nausea and dizziness   Saccades Slow  2/2 incr. dizziness and nausea. L side: 9-10/10  dizzy/nausea   Comment R side saccades: 7-8/10 dizziness and nausea. Smooth pursuits L side: 5/10 dizziness and 7/10 nausea and pt reported everything "a little less" on right side.   Vestibulo-Occular Reflex   VOR 1 Head Only (x 1 viewing) At slow speed and provoked dizziness 10/10, nausea: 6/10.   Positional Testing   Sidelying Test Sidelying Right;Sidelying Left   Horizontal Canal Testing Horizontal Canal Right;Horizontal Canal Left   Sidelying Right   Sidelying Right Duration None   Sidelying Right Symptoms No nystagmus   Sidelying Left   Sidelying Left Duration none   Sidelying Left Symptoms No nystagmus   Horizontal Canal Right   Horizontal Canal Right Duration none   Horizontal Canal Right Symptoms Normal   Horizontal Canal Left   Horizontal Canal Left Duration none   Horizontal Canal Left Symptoms Normal   Positional Sensitivities   Positional Sensitivities Comments Pt reported slight dizziness and blurred vision (lasting less than 5 seconds) when lying in supine and turning head to the L side.           Neuro re-ed:     Vestibular Treatment/Exercise - 08/08/15 0001    Vestibular Treatment/Exercise   Vestibular Treatment Provided Gaze   Habituation Exercises Seated Horizontal Head Turns;Seated Vertical Head Turns   Seated Horizontal Head Turns   Number of Reps  2   Symptom Description  Pt reported increased nausea/dizziness 8-9/10 during first rep (performed for 20 seconds). PT decr. time to 5 seconds during second rep and pt reported this was tolerable and letter did not blur when she decr. speed and reduce size of head movements.   Seated Vertical Head Turns   Number of Reps  2   Symptom Description  Increased dizziness/nausea, but tolerable during 5 second duration.               PT Education - 08/08/15 1609    Education provided Yes   Education Details Discussed exam findings and potential L hypofunctioning of vestibular system. PT provided pt with  gaze stab. HEP. PT also discussed PT frequency/duration and the importance of continue to move/walk without provoking dizziness that causes pt to fall, encouraged pt to walk with husband vs. Alone. PT encouraged pt to inform MD she ceased BP medication, and also encouraged pt to f/u with MD regarding referral to vascular surgery for aneurysm.    Person(s) Educated Patient   Methods Explanation;Demonstration;Verbal cues;Handout   Comprehension Verbalized understanding;Returned demonstration;Need further instruction          PT Short Term Goals - 08/08/15 1620    PT SHORT TERM GOAL #1   Title same as LTGs           PT  Long Term Goals - 08/08/15 1621    PT LONG TERM GOAL #1   Title Pt will be IND in HEP to reduce dizziness and nausea. Target date: 09/05/15   Status New   PT LONG TERM GOAL #2   Title Perform DGI and write goal. Target date: 09/05/15   Status New   PT LONG TERM GOAL #3   Title Pt will be able to amb. >/=1,000 IND, over even /uneven terrain, without dizziness/nausea to improve functional mobility. Target date: 09/05/15   Status New   PT LONG TERM GOAL #4   Title Pt will improve DHI from 54 to 36 to improve quality of life. Target date: 09/05/15   Status New               Plan - 08/08/15 1613    Clinical Impression Statement Pt is a pleasant 54y/o female presenting to OPPT neuro with history of vestibular migraines. Pt presented with dizziness, lightheadedness, nausea, gait devivations, guarded movement, and impaired balance.  Vestibular exam limitied 2/2 intense nausea, but findings consistent with hypofunctioning of vestibular system. PT will perform DGI next session  to determine falls risk next session, as tolerated.    Pt will benefit from skilled therapeutic intervention in order to improve on the following deficits Abnormal gait;Decreased balance;Decreased knowledge of use of DME;Dizziness   Rehab Potential Good   Clinical Impairments Affecting Rehab Potential  aneurysm   PT Frequency 2x / week   PT Duration 4 weeks   PT Treatment/Interventions ADLs/Self Care Home Management;Biofeedback;Canalith Repostioning;Stair training;Gait training;DME Instruction;Neuromuscular re-education;Balance training;Therapeutic exercise;Therapeutic activities;Manual techniques;Vestibular   PT Next Visit Plan Perform DGI and write goal. Balance activities as tolerated.   PT Home Exercise Plan Gaze stab. HEP   Consulted and Agree with Plan of Care Patient         Problem List Patient Active Problem List   Diagnosis Date Noted  . Aneurysm, cerebral, nonruptured 07/24/2015  . Weakness 07/18/2015  . Numbness and tingling in left arm 07/18/2015  . Tingling of left arm and left side of face 07/18/2015  . Essential hypertension 07/18/2015  . Migraine without aura and without status migrainosus, not intractable 07/16/2015  . Vestibular migraine 07/16/2015    Ygnacio Fecteau L 08/08/2015, 4:27 PM  [facility] [address] [place], [place], [address] Phone: [phone]   Fax:  [phone]  Name: Christy Clayton MRN: [id number] Date of Birth: July 26, 1961   Zerita Boers, PT,DPT 08/08/2015 4:27 PM Phone: [phone] Fax: [phone]

## 2015-08-08 NOTE — Patient Instructions (Signed)
Gaze Stabilization: Sitting    Keeping eyes on target on wall 3-5 feet away, tilt head down 15-30 and move head side to side for _5-10___ seconds. Repeat while moving head up and down for __5-10__ seconds. Do __2-3__ sessions per day.  Copyright  VHI. All rights reserved.   Gaze Stabilization: Tip Card  1.Target must remain in focus, not blurry, and appear stationary while head is in motion. 2.Perform exercises with small head movements (45 to either side of midline). 3.Increase speed of head motion so long as target is in focus. 4.If you wear eyeglasses, be sure you can see target through lens (therapist will give specific instructions for bifocal / progressive lenses). 5.These exercises may provoke dizziness or nausea. Work through these symptoms. If too dizzy, slow head movement slightly. Rest between each exercise. 6.Exercises demand concentration; avoid distractions. 7.For safety, perform standing exercises close to a counter, wall, corner, or next to someone.  Copyright  VHI. All rights reserved.

## 2015-08-09 ENCOUNTER — Ambulatory Visit: Payer: Self-pay | Admitting: Neurology

## 2015-08-13 ENCOUNTER — Ambulatory Visit: Payer: BLUE CROSS/BLUE SHIELD

## 2015-08-13 ENCOUNTER — Encounter: Payer: Self-pay | Admitting: Vascular Surgery

## 2015-08-13 ENCOUNTER — Ambulatory Visit (INDEPENDENT_AMBULATORY_CARE_PROVIDER_SITE_OTHER): Payer: BLUE CROSS/BLUE SHIELD | Admitting: Vascular Surgery

## 2015-08-13 VITALS — Ht 66.0 in | Wt 191.0 lb

## 2015-08-13 DIAGNOSIS — I671 Cerebral aneurysm, nonruptured: Secondary | ICD-10-CM

## 2015-08-13 NOTE — Progress Notes (Signed)
   Patient name: Christy Clayton MRN: [id number] DOB: [birth year] Sex: female    HPI: Christy Clayton is a 55 y.o. female  Referred in error to us. She had a recent episode of left-sided numbness and her workup was found to have a left intracranial aneurysm. She is here today for evaluation of this. She had complete resolution of her symptoms was felt to be related to prednisone. She did have a carotid duplex which I reviewed which was completely normal.   Current Outpatient Prescriptions  Medication Sig Dispense Refill  . amitriptyline (ELAVIL) 25 MG tablet Take 25 mg by mouth at bedtime.    Marland Kitchen. aspirin EC 81 MG tablet Take 1 tablet (81 mg total) by mouth daily. 30 tablet 0  . cetirizine (ZYRTEC) 10 MG tablet Take 10 mg by mouth daily.    Marland Kitchen. esomeprazole (NEXIUM) 40 MG capsule Take 40 mg by mouth daily at 12 noon.    . fosinopril-hydrochlorothiazide (MONOPRIL-HCT) 10-12.5 MG tablet Take 1 tablet by mouth daily.    . meclizine (ANTIVERT) 25 MG tablet Take 25 mg by mouth 3 (three) times daily as needed for dizziness.    . Multiple Vitamins-Minerals (MULTIVITAMIN WOMEN PO) Take by mouth.    . ondansetron (ZOFRAN) 8 MG tablet Take by mouth every 8 (eight) hours as needed for nausea or vomiting.    Marland Kitchen. atorvastatin (LIPITOR) 20 MG tablet Take 1 tablet (20 mg total) by mouth daily. (Patient not taking: Reported on 08/08/2015) 30 tablet 0  . Diclofenac Potassium 50 MG PACK 50mg  once at earliest onset of migraine.  Take on empty stomach (Patient not taking: Reported on 08/08/2015) 1 each 3   No current facility-administered medications for this visit.    REVIEW OF SYSTEMS:  [X]  denotes positive finding, [ ]  denotes negative finding Cardiac  Comments:  Chest pain or chest pressure:    Shortness of breath upon exertion:    Short of breath when lying flat:    Irregular heart rhythm:    Constitutional    Fever or chills:      PHYSICAL EXAM: Filed Vitals:   08/13/15 1110  Height: 5\' 6"  (1.676 m)  Weight:  191 lb (86.637 kg)    GENERAL: The patient is a well-nourished female, in no acute distress. The vital signs are documented above. CARDIOVASCULAR:  2+ radial pulses, no carotid bruits noted bilaterally  grossly intact neurologically  MEDICAL ISSUES:  reviewed her MRA. 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  schisis with patient. There would be no charge for today's visit. We will contact [facility] and assure the appropriate consultation regarding her small intracranial aneurysm  [facility] [place] Beeper: [phone]

## 2015-08-13 NOTE — Therapy (Signed)
[facility] [address] [place], [place], [address] Phone: [phone]   Fax:  [phone]  Physical Therapy Treatment  Patient Details  Name: Emree Locicero MRN: [id number] Date of Birth: 1960-10-05 Referring Provider: Dr. Everlena Cooper  Encounter Date: 08/13/2015  NO CHARGE FOR VISIT.       PT End of Session - 08/13/15 1341    Visit Number 1   Number of Visits 9   Date for PT Re-Evaluation 09/07/15   Authorization Type [employer]BS-per Lisa on 08/13/15, pt's insurance termed on 08/02/16.       Past Medical History  Diagnosis Date  . Hypertension   . Migraines   . Environmental allergies     Past Surgical History  Procedure Laterality Date  . Total abdominal hysterectomy    . Ganglion cyst excision Left     Wrist  . Lumbar fusion    . Nasal sinus surgery    . Fracture surgery      left arm  . Shoulder arthroscopy      There were no vitals filed for this visit.  Visit Diagnosis:  Dizziness and giddiness                                 PT Short Term Goals - 08/08/15 1620    PT SHORT TERM GOAL #1   Title same as LTGs           PT Long Term Goals - 08/08/15 1621    PT LONG TERM GOAL #1   Title Pt will be IND in HEP to reduce dizziness and nausea. Target date: 09/05/15   Status New   PT LONG TERM GOAL #2   Title Perform DGI and write goal. Target date: 09/05/15   Status New   PT LONG TERM GOAL #3   Title Pt will be able to amb. >/=1,000 IND, over even /uneven terrain, without dizziness/nausea to improve functional mobility. Target date: 09/05/15   Status New   PT LONG TERM GOAL #4   Title Pt will improve DHI from 54 to 36 to improve quality of life. Target date: 09/05/15   Status New               Plan - 08/13/15 1341    Clinical Impression Statement No charge today, as pt arrived and notified that [employer] termed on 08/02/16. Pt stated she still has the insurance and will call  [employer] and notify front office if she is still covered. Pt reported she feels better after performing gaze HEP.   Pt will benefit from skilled therapeutic intervention in order to improve on the following deficits Abnormal gait;Decreased balance;Decreased knowledge of use of DME;Dizziness   Rehab Potential Good   Clinical Impairments Affecting Rehab Potential aneurysm   PT Frequency 2x / week   PT Duration 4 weeks   PT Treatment/Interventions ADLs/Self Care Home Management;Biofeedback;Canalith Repostioning;Stair training;Gait training;DME Instruction;Neuromuscular re-education;Balance training;Therapeutic exercise;Therapeutic activities;Manual techniques;Vestibular   PT Next Visit Plan Perform DGI and write goal. Balance activities as tolerated.        Problem List Patient Active Problem List   Diagnosis Date Noted  . Aneurysm, cerebral, nonruptured 07/24/2015  . Weakness 07/18/2015  . Numbness and tingling in left arm 07/18/2015  . Tingling of left arm and left side of face 07/18/2015  . Essential hypertension 07/18/2015  . Migraine without aura and without status migrainosus, not intractable 07/16/2015  .  Vestibular migraine 07/16/2015    Jevante Hollibaugh L 08/13/2015, 1:42 PM  [facility] [address] [place], [place], [address] Phone: [phone]   Fax:  [phone]  Name: Tawanna SatSally Millon MRN: [id number] Date of Birth: [birth year]    Zerita BoersJennifer Daejah Klebba, PT,DPT 08/13/2015 1:43 PM Phone: [phone] Fax: [phone]

## 2015-08-26 ENCOUNTER — Ambulatory Visit: Payer: BLUE CROSS/BLUE SHIELD | Admitting: Rehabilitative and Restorative Service Providers"

## 2015-08-27 ENCOUNTER — Other Ambulatory Visit: Payer: Self-pay

## 2015-08-27 DIAGNOSIS — I671 Cerebral aneurysm, nonruptured: Secondary | ICD-10-CM

## 2015-08-29 ENCOUNTER — Ambulatory Visit: Payer: BLUE CROSS/BLUE SHIELD

## 2015-08-29 DIAGNOSIS — R42 Dizziness and giddiness: Secondary | ICD-10-CM | POA: Diagnosis not present

## 2015-08-29 DIAGNOSIS — R269 Unspecified abnormalities of gait and mobility: Secondary | ICD-10-CM

## 2015-08-29 NOTE — Patient Instructions (Signed)
Flexibility: Upper Trapezius Stretch    Gently grasp right side of head while reaching behind back with other hand. Tilt head away until a gentle stretch is felt. Hold __30__ seconds. Repeat __3__ times per set. Do _1___ sets per session. Do __1__ sessions per day.  http://orth.exer.us/341   Copyright  VHI. All rights reserved.    Levator Scapula Stretch, Sitting    Sit, one hand tucked under hip on side to be stretched, other hand over top of head. Turn head toward other side and look down. Use hand on head to gently stretch neck in that position. Hold _30__ seconds. Repeat _3__ times per session. Do _1__ sessions per day.  Copyright  VHI. All rights reserved.     Perform the following exercises in the corner with chair in front of you for safety:   Feet Together (Compliant Surface) Varied Arm Positions - Eyes Closed    Stand on compliant surface: ___pillow/cusion_____ with feet together and arms at your side. Close eyes and visualize upright position. Hold__30__ seconds. Repeat __3__ times per session. Do __1__ sessions per day.  Copyright  VHI. All rights reserved.  Feet Together (Compliant Surface) Head Motion - Eyes Open    With eyes open, standing on compliant surface: __pillow/cushion______, feet together, move head slowly: up and down 10 times and side to side 10 times. Repeat __3__ times per session. Do __1__ sessions per day.  Copyright  VHI. All rights reserved.  Feet Heel-Toe "Tandem" (Compliant Surface) Varied Arm Positions - Eyes Open    With eyes open, standing on compliant surface: __pillow/cushion______, right foot directly in front of the other and arms at your side, look at a stationary object. Hold __30__ seconds. Repeat __3__ times per session. Do __1__ sessions per day.  Copyright  VHI. All rights reserved.

## 2015-08-29 NOTE — Therapy (Signed)
[facility] [address] [place], [place], [address] Phone: [phone]   Fax:  [phone]  Physical Therapy Treatment  Patient Details  Name: Christy Clayton MRN: [id number] Date of Birth: [birth year] Referring Provider: Dr. Everlena Cooper  Encounter Date: 08/29/2015      PT End of Session - 08/29/15 1601    Visit Number 2   Number of Visits 9   Date for PT Re-Evaluation 09/07/15   Authorization Type BCBS   PT Start Time 1405  pt late   PT Stop Time 1445   PT Time Calculation (min) 40 min   Equipment Utilized During Treatment Gait belt   Activity Tolerance Patient tolerated treatment well   Behavior During Therapy [facility] for tasks assessed/performed      Past Medical History  Diagnosis Date  . Hypertension   . Migraines   . Environmental allergies     Past Surgical History  Procedure Laterality Date  . Total abdominal hysterectomy    . Ganglion cyst excision Left     Wrist  . Lumbar fusion    . Nasal sinus surgery    . Fracture surgery      left arm  . Shoulder arthroscopy      There were no vitals filed for this visit.  Visit Diagnosis:  Dizziness and giddiness  Abnormality of gait      Subjective Assessment - 08/29/15 1406    Subjective Pt reported she saw the surgeon regarding aneurysm and reported it is outside of the brain but near the eye, therefore, they will monitor it and not do surgery at this time. Pt reported dizziness has been better, but still has HA/nausea. Pt reported dizziness approx. 8 days, with intensity at approx. 6-7/10. Pt feels that HEP has been helping.    Pertinent History Vestibular migraine, newly diagnosed ICA aneurysm-pt referred to vascular surgery to monitor, HTN   Patient Stated Goals Get back out and doing things, "I don't like being down and out".    Currently in Pain? Yes   Pain Score 6    Pain Location --  headache   Pain Descriptors / Indicators Headache   Pain Type  Acute pain   Pain Onset Today   Aggravating Factors  movement, light, and noise   Pain Relieving Factors rest            [facility] PT Assessment - 08/29/15 1417    Functional Gait  Assessment   Gait assessed  Yes   Gait Level Surface Walks 20 ft in less than 5.5 sec, no assistive devices, good speed, no evidence for imbalance, normal gait pattern, deviates no more than 6 in outside of the 12 in walkway width.   Change in Gait Speed Able to smoothly change walking speed without loss of balance or gait deviation. Deviate no more than 6 in outside of the 12 in walkway width.   Gait with Horizontal Head Turns Performs head turns smoothly with slight change in gait velocity (eg, minor disruption to smooth gait path), deviates 6-10 in outside 12 in walkway width, or uses an assistive device.  slight dizziness   Gait with Vertical Head Turns Performs head turns with no change in gait. Deviates no more than 6 in outside 12 in walkway width.   Gait and Pivot Turn Pivot turns safely within 3 sec and stops quickly with no loss of balance.   Step Over Obstacle Is able to step over 2 stacked shoe boxes taped together (  9 in total height) without changing gait speed. No evidence of imbalance.   Gait with Narrow Base of Support Ambulates 7-9 steps.   Gait with Eyes Closed Walks 20 ft, uses assistive device, slower speed, mild gait deviations, deviates 6-10 in outside 12 in walkway width. Ambulates 20 ft in less than 9 sec but greater than 7 sec.   Ambulating Backwards Walks 20 ft, uses assistive device, slower speed, mild gait deviations, deviates 6-10 in outside 12 in walkway width.   Steps Alternating feet, no rail.   Total Score 26                     [facility] Adult PT Treatment/Exercise - 08/29/15 1419    Standardized Balance Assessment   Standardized Balance Assessment Dynamic Gait Index   Dynamic Gait Index   Level Surface Normal   Change in Gait Speed Normal   Gait with Horizontal Head  Turns Mild Impairment   Gait with Vertical Head Turns Normal   Gait and Pivot Turn Normal   Step Over Obstacle Normal   Step Around Obstacles Normal   Steps Normal   Total Score 23      Therex: Pt performed balance HEP in corner with chair in front and S for safety. Cues for safety. Pt performed all exercises with feet apart/together. Pt performed stretches in seated position as she does not like supine position. Cues and demonstration for technique. Please see pt instructions for details.          PT Education - 08/29/15 1600    Education provided Yes   Education Details PT provided pt with stretches and balance HEP, pt performed.    Person(s) Educated Patient   Methods Explanation;Demonstration;Verbal cues;Handout   Comprehension Verbalized understanding;Returned demonstration          PT Short Term Goals - 08/08/15 1620    PT SHORT TERM GOAL #1   Title same as LTGs           PT Long Term Goals - 08/29/15 1605    PT LONG TERM GOAL #1   Title Pt will be IND in HEP to reduce dizziness and nausea. Target date: 09/05/15   Status On-going   PT LONG TERM GOAL #2   Title Perform DGI and write goal. Target date: 09/05/15   Baseline No goal written as DGI and FGA WNL.   Status Achieved   PT LONG TERM GOAL #3   Title Pt will be able to amb. >/=1,000 IND, over even /uneven terrain, without dizziness/nausea to improve functional mobility. Target date: 09/05/15   Status On-going   PT LONG TERM GOAL #4   Title Pt will improve DHI from 54 to 36 to improve quality of life. Target date: 09/05/15   Status On-going               Plan - 08/29/15 1601    Clinical Impression Statement Pt demonstrated progress as she was able to tolerate HEP and balance testing with only "small" amounts of incr. in dizziness to 3/10. Pt's scores on DGI and FGA indicate pt is not at risk for falls. Pt did experience incr. dizziness and postural sway during activity with eyes closed/head turns, which  is consistent for decr. input from vestibular system. Pt's cervical AROM WFL, except for L rotation (able to obtain approx. 75% of normal range). During palpation of cervical muscles (UT,LS, extensors) PT found incr. tension in UT and LS (R>L). PT added cervical stretches  in order to decr. cervical extensor tension. On GROC , pt reported she is +6 better than first visit after performing gaze stab. HEP. Continue with POC.    Pt will benefit from skilled therapeutic intervention in order to improve on the following deficits Abnormal gait;Decreased balance;Decreased knowledge of use of DME;Dizziness   Rehab Potential Good   Clinical Impairments Affecting Rehab Potential aneurysm   PT Frequency 2x / week   PT Duration 4 weeks   PT Treatment/Interventions ADLs/Self Care Home Management;Biofeedback;Canalith Repostioning;Stair training;Gait training;DME Instruction;Neuromuscular re-education;Balance training;Therapeutic exercise;Therapeutic activities;Manual techniques;Vestibular   PT Next Visit Plan High level balance activities (rockerboard, compliant surfaces). Manual therapy as tolerated.    PT Home Exercise Plan Gaze stab. and balance HEP   Consulted and Agree with Plan of Care Patient        Problem List Patient Active Problem List   Diagnosis Date Noted  . Aneurysm, cerebral, nonruptured 07/24/2015  . Weakness 07/18/2015  . Numbness and tingling in left arm 07/18/2015  . Tingling of left arm and left side of face 07/18/2015  . Essential hypertension 07/18/2015  . Migraine without aura and without status migrainosus, not intractable 07/16/2015  . Vestibular migraine 07/16/2015    Jamaria Amborn L 08/29/2015, 4:06 PM  [facility] [address] [place], [place], [address] Phone: [phone]   Fax:  [phone]  Name: Christy Clayton MRN: [id number] Date of Birth: [birth year]     Zerita Boers, PT,DPT 08/29/2015 4:06  PM Phone: [phone] Fax: [phone]

## 2015-09-03 ENCOUNTER — Ambulatory Visit: Payer: BLUE CROSS/BLUE SHIELD

## 2015-09-03 DIAGNOSIS — R42 Dizziness and giddiness: Secondary | ICD-10-CM

## 2015-09-03 DIAGNOSIS — R269 Unspecified abnormalities of gait and mobility: Secondary | ICD-10-CM

## 2015-09-03 NOTE — Therapy (Signed)
[facility] [address] [place], [place], [address] Phone: [phone]   Fax:  [phone]  Physical Therapy Treatment  Patient Details  Name: Christy Clayton MRN: [id number] Date of Birth: 16-Feb-1961 Referring Provider: Dr. Everlena Cooper  Encounter Date: 09/03/2015      PT End of Session - 09/03/15 1522    Visit Number 3   Number of Visits 9   Date for PT Re-Evaluation 09/07/15   Authorization Type BCBS   PT Start Time 1318   PT Stop Time 1359   PT Time Calculation (min) 41 min   Equipment Utilized During Treatment Gait belt   Activity Tolerance Patient tolerated treatment well   Behavior During Therapy [facility] for tasks assessed/performed      Past Medical History  Diagnosis Date  . Hypertension   . Migraines   . Environmental allergies     Past Surgical History  Procedure Laterality Date  . Total abdominal hysterectomy    . Ganglion cyst excision Left     Wrist  . Lumbar fusion    . Nasal sinus surgery    . Fracture surgery      left arm  . Shoulder arthroscopy      There were no vitals filed for this visit.  Visit Diagnosis:  Dizziness and giddiness  Abnormality of gait      Subjective Assessment - 09/03/15 1322    Subjective Pt denied falls or changes since last visit.    Pertinent History Vestibular migraine, newly diagnosed ICA aneurysm-pt referred to vascular surgery to monitor, HTN   Patient Stated Goals Get back out and doing things, "I don't like being down and out".    Currently in Pain? No/denies                         [facility] Adult PT Treatment/Exercise - 09/03/15 0001    Ambulation/Gait   Ambulation/Gait Yes   Ambulation/Gait Assistance 5: Supervision   Ambulation/Gait Assistance Details Cues to improve narrow BOS during amb. over uneven terrain. Pt performed head turns without LOB but did experience incr. postural sway.    Ambulation Distance (Feet) 1000 Feet   Assistive  device None   Gait Pattern Decreased stride length;Narrow base of support   Ambulation Surface Level;Unlevel;Indoor;Outdoor;Paved;Grass  rubber mulch   High Level Balance   High Level Balance Activities Head turns  walking forward   High Level Balance Comments With min guard, pt amb. over red/blue mats with bean bags placed under mat, 4x7'/activity, while performing head turns/nods.              Balance Exercises - 09/03/15 1324    Balance Exercises: Standing   Standing Eyes Opened Narrow base of support (BOS);Wide (BOA);Head turns;Foam/compliant surface;Other reps (comment);20 secs  10 reps   Standing Eyes Closed Wide (BOA);Narrow base of support (BOS);20 secs   Tandem Gait Forward;4 reps  no UE support   Other Standing Exercises Rockerboard and tandem gait on foam performed in // bars with min guard-min A: bosu ball-squats x10 reps, pt stood on foam and rockerboard and reached outside BOS (UE reached to contralateral side) 2x20 reps. Cues for technique. Pt also amb. Over mats/bean bags while tossing/catching ball with min guard (and min A during one LOB episode).            PT Education - 09/03/15 1519    Education provided Yes   Education Details PT educated pt on the importance  of safety while hiking, and encouraged her to only try low to moderate trails and not strenuous trails until dizziness resolves.   Person(s) Educated Patient   Methods Explanation   Comprehension Verbalized understanding          PT Short Term Goals - 08/08/15 1620    PT SHORT TERM GOAL #1   Title same as LTGs           PT Long Term Goals - 08/29/15 1605    PT LONG TERM GOAL #1   Title Pt will be IND in HEP to reduce dizziness and nausea. Target date: 09/05/15   Status On-going   PT LONG TERM GOAL #2   Title Perform DGI and write goal. Target date: 09/05/15   Baseline No goal written as DGI and FGA WNL.   Status Achieved   PT LONG TERM GOAL #3   Title Pt will be able to amb. >/=1,000  IND, over even /uneven terrain, without dizziness/nausea to improve functional mobility. Target date: 09/05/15   Status On-going   PT LONG TERM GOAL #4   Title Pt will improve DHI from 54 to 36 to improve quality of life. Target date: 09/05/15   Status On-going               Plan - 09/03/15 1523    Clinical Impression Statement Pt demonstrated progress as she was able to tolerate high level balance activities. Pt continues to experience increased postural sway and LOB during activities with eyes close, while standing on compliant surfaces. Continue with POC.   Pt will benefit from skilled therapeutic intervention in order to improve on the following deficits Abnormal gait;Decreased balance;Decreased knowledge of use of DME;Dizziness   Rehab Potential Good   Clinical Impairments Affecting Rehab Potential aneurysm   PT Frequency 2x / week   PT Duration 4 weeks   PT Treatment/Interventions ADLs/Self Care Home Management;Biofeedback;Canalith Repostioning;Stair training;Gait training;DME Instruction;Neuromuscular re-education;Balance training;Therapeutic exercise;Therapeutic activities;Manual techniques;Vestibular   PT Next Visit Plan Check goals and d/c if appropriate.    PT Home Exercise Plan Gaze stab. and balance HEP   Consulted and Agree with Plan of Care Patient        Problem List Patient Active Problem List   Diagnosis Date Noted  . Aneurysm, cerebral, nonruptured 07/24/2015  . Weakness 07/18/2015  . Numbness and tingling in left arm 07/18/2015  . Tingling of left arm and left side of face 07/18/2015  . Essential hypertension 07/18/2015  . Migraine without aura and without status migrainosus, not intractable 07/16/2015  . Vestibular migraine 07/16/2015    Jashad Depaula L 09/03/2015, 3:24 PM  [facility] [address] [place], [place], [address] Phone: [phone]   Fax:  [phone]  Name: Christy Clayton MRN: [id number] Date of Birth: May 17, 1961    Zerita Boers, PT,DPT 09/03/2015 3:25 PM Phone: [phone] Fax: [phone]

## 2015-09-06 ENCOUNTER — Ambulatory Visit: Payer: BLUE CROSS/BLUE SHIELD | Attending: Neurology | Admitting: Physical Therapy

## 2015-09-06 DIAGNOSIS — R42 Dizziness and giddiness: Secondary | ICD-10-CM | POA: Insufficient documentation

## 2015-09-06 NOTE — Therapy (Addendum)
[facility] [address] [place], [place], [address] Phone: [phone]   Fax:  [phone]  Physical Therapy Treatment  Patient Details  Name: Christy Clayton MRN: [id number] Date of Birth: 23-Mar-1961 Referring Provider: Dr. Tomi Likens  Encounter Date: 09/06/2015      PT End of Session - 09/06/15 1445    Visit Number 4   Number of Visits 9   Date for PT Re-Evaluation 09/07/15   Authorization Type BCBS   PT Start Time 1405   PT Stop Time 6962  Session ended early due to goals met, pt discharged   PT Time Calculation (min) 25 min   Activity Tolerance Patient tolerated treatment well   Behavior During Therapy [facility] for tasks assessed/performed      Past Medical History  Diagnosis Date  . Hypertension   . Migraines   . Environmental allergies     Past Surgical History  Procedure Laterality Date  . Total abdominal hysterectomy    . Ganglion cyst excision Left     Wrist  . Lumbar fusion    . Nasal sinus surgery    . Fracture surgery      left arm  . Shoulder arthroscopy      There were no vitals filed for this visit.  Visit Diagnosis:  Dizziness and giddiness      Subjective Assessment - 09/06/15 1407    Subjective "I walked 4 miles a few days ago. It was fine. I didn't have any trouble."   Pt reports no significant changes, no falls. No episodes of dizziness. Pt requesting to discharge from outpatient PT today due to pt perception of no functional limitations.   Pertinent History Vestibular migraine, newly diagnosed ICA aneurysm-pt referred to vascular surgery to monitor, HTN   Patient Stated Goals Get back out and doing things, "I don't like being down and out".    Currently in Pain? No/denies                         [facility] Adult PT Treatment/Exercise - 09/06/15 0001    Ambulation/Gait   Ambulation/Gait Yes   Ambulation/Gait Assistance 7: Independent   Ambulation/Gait Assistance Details Pt  performed head functional horizontal head turns with no gait deviations, no overt LOB.   Ambulation Distance (Feet) 1440 Feet   Assistive device None   Gait Pattern Within Functional Limits;Step-through pattern   Ambulation Surface Level;Unlevel;Indoor;Outdoor;Paved;Grass                PT Education - 09/06/15 1425    Education provided Yes   Education Details Education on triggers for migraine headache, including dietary considerations from [place].   Person(s) Educated Patient   Methods Explanation;Handout   Comprehension Verbalized understanding          PT Short Term Goals - 08/08/15 1620    PT SHORT TERM GOAL #1   Title same as LTGs           PT Long Term Goals - 09/06/15 1438    PT LONG TERM GOAL #1   Title Pt will be IND in HEP to reduce dizziness and nausea. Target date: 09/05/15   Status Achieved   PT LONG TERM GOAL #2   Title Perform DGI and write goal. Target date: 09/05/15   Baseline No goal written as DGI and FGA WNL.   Status Achieved   PT LONG TERM GOAL #3   Title Pt will be able to amb. >/=  1,000 IND, over even /uneven terrain, without dizziness/nausea to improve functional mobility. Target date: 09/05/15   Baseline 2/3: Pt independent with gait x1,440' over paved and grass surfaces.   Status Achieved   PT LONG TERM GOAL #4   Title Pt will improve DHI from 54 to 36 to improve quality of life. Target date: 09/05/15   Baseline 2/3: [place] = 6.   Status Achieved               Plan - 09/06/15 1447    Clinical Impression Statement Pt requesting to discharge from vestibular PT today due to pt perception of no further functional limitations. LTG assessment indicated pt has met all stated goals at this time. Pt provided with educational material on vestibular migraine triggers and dietary considerations (from  [facility]) with verbal understanding from pt.    Consulted and Agree with Plan of Care Patient        Problem List Patient Active Problem List    Diagnosis Date Noted  . Aneurysm, cerebral, nonruptured 07/24/2015  . Weakness 07/18/2015  . Numbness and tingling in left arm 07/18/2015  . Tingling of left arm and left side of face 07/18/2015  . Essential hypertension 07/18/2015  . Migraine without aura and without status migrainosus, not intractable 07/16/2015  . Vestibular migraine 07/16/2015   PHYSICAL THERAPY DISCHARGE SUMMARY  Visits from Start of Care:  4  Current functional level related to goals / functional outcomes:  See functional goals and goal statuses above.  Remaining deficits: Pt perceives returned to baseline functional status; however, pt noted to have balance impairments when increased reliance on vestibular input required.   Education / Equipment: HEP and progression; migraine triggers and dietary considerations. Plan: Patient agrees to discharge.  Patient goals were met. Patient is being discharged due to meeting the stated rehab goals.  ???        Name: Christy Clayton MRN: [id number] Date of Birth: 09-Oct-1960

## 2015-11-07 ENCOUNTER — Ambulatory Visit (INDEPENDENT_AMBULATORY_CARE_PROVIDER_SITE_OTHER): Payer: BLUE CROSS/BLUE SHIELD | Admitting: Neurology

## 2015-11-07 ENCOUNTER — Encounter: Payer: Self-pay | Admitting: Neurology

## 2015-11-07 VITALS — BP 114/72 | HR 70 | Ht 66.0 in | Wt 188.0 lb

## 2015-11-07 DIAGNOSIS — G43809 Other migraine, not intractable, without status migrainosus: Secondary | ICD-10-CM

## 2015-11-07 DIAGNOSIS — G43109 Migraine with aura, not intractable, without status migrainosus: Secondary | ICD-10-CM | POA: Diagnosis not present

## 2015-11-07 MED ORDER — KETOROLAC TROMETHAMINE 60 MG/2ML IM SOLN
60.0000 mg | Freq: Once | INTRAMUSCULAR | Status: AC
  Administered 2015-11-07: 60 mg via INTRAMUSCULAR

## 2015-11-07 NOTE — Patient Instructions (Signed)
1.  Continue amitriptyline  2.  I will look into getting you the [place] since you have taken everything else. 3.  We will get you a Toradol 60mg  shot 4.  Follow up in 6 months.

## 2015-11-07 NOTE — Progress Notes (Signed)
NEUROLOGY FOLLOW UP OFFICE NOTE  Christy Clayton [id number]  HISTORY OF PRESENT ILLNESS: Christy Clayton is a 55 year old right-handed female with vestibular migraine who follows up for vestibular migraine and left sided numbness.  History obtained by patient, endovascular note and labs reviewed.  UPDATE: Headaches have been well-controlled.  This is her first headache in quite a while (since I last saw her). I prescribed her Verlin Fester, but it was denied by her insurance company. Current preventative medication:  Amitriptyline   Other current therapy:  vestibular rehab.  ASA negative, Sed Rate 14, B12 349.  HISTORY: She has had migraines "all of my life."  They start in the back of her neck and radiate to the left side of her head and face.  It is a 10/10 throbbing pain.  They are associated with blurred vision, nausea, vomiting, photophobia and phonophobia.  They last several hours without medication but abort in 2 hours after two dose of Frova.  They occur 4-5 times a year.  There are no particular triggers.  Her father has migraines.  Over the past 5 years, she has experienced vertigo and dizziness following some migraines.  It seems to occur in the autumn or spring.  She initially feels spinning sensation which becomes a persistent lightheadedness.  She continues to feel nauseous.  They usually last a week but last autumn and currently, it has persisted for 5 weeks.  Last year, she was out of work for a month.  Her last migraine was 5 weeks ago, and she continues to have persistent dizziness.  She has seen ENT in the past with unremarkable evaluation.  On 07/18/15, she was admitted to [facility] for onset of numbness and tingling involving the face, arm and leg.  It started the day after taking the first dose of prednisone.  CT of head was negative for acute intracranial process.  MRI of the brain was normal.  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  She  underwent TIA workup.  Carotid doppler showed no hemodynamically significant ICA stenosis.  Echo showed EF of 55-60% with no cardiac source of embolus.  LDL was 155.  Telemetry was negative for A fib.  She was discharged on ASA  daily and Lipitor  daily.  She was advised not to take triptans.  She continues to have numbness and tingling and she feels that the paresthesia in the upper left leg is worse.  She was advised to continue the prednisone taper but paresthesia and dizziness persists.  Past abortive medication:  Imitrex. Unable to use injections due to anxiety, Frova.  Multiple NSAIDs such as ibuprofen and naproxen.   Past preventative medication:  none  PAST MEDICAL HISTORY: Past Medical History  Diagnosis Date  . Hypertension   . Migraines   . Environmental allergies     MEDICATIONS: Current Outpatient Prescriptions on File Prior to Visit  Medication Sig Dispense Refill  . amitriptyline (ELAVIL) 25 MG tablet Take 25 mg by mouth at bedtime.    Marland Kitchen aspirin EC 81 MG tablet Take 1 tablet (81 mg total) by mouth daily. 30 tablet 0  . cetirizine (ZYRTEC) 10 MG tablet Take 10 mg by mouth daily.    Marland Kitchen esomeprazole (NEXIUM) 40 MG capsule Take 40 mg by mouth daily at 12 noon.    . fosinopril-hydrochlorothiazide (MONOPRIL-HCT) 10-12.5 MG tablet Take 1 tablet by mouth daily.    . Multiple Vitamins-Minerals (MULTIVITAMIN WOMEN PO) Take by mouth.  No current facility-administered medications on file prior to visit.    ALLERGIES: Allergies  Allergen Reactions  . Allegra [Fexofenadine] Nausea And Vomiting  . Celebrex [Celecoxib] Nausea And Vomiting  . Codeine Nausea And Vomiting  . Neurontin [Gabapentin] Nausea And Vomiting  . Oxycodone Nausea And Vomiting  . Prednisone Other (See Comments)    Stroke like symptoms.Tingling on Left side from head to toes.   . Sulfa Antibiotics Nausea And Vomiting  . Atorvastatin Rash  . Cortaid [Hydrocortisone] Rash  . Promethazine Nausea And  Vomiting and Rash    FAMILY HISTORY: Family History  Problem Relation Age of Onset  . Migraines Father   . Stroke Paternal Grandmother     SOCIAL HISTORY: Social History   Social History  . Marital Status: Married    Spouse Name: N/A  . Number of Children: N/A  . Years of Education: N/A   Occupational History  . Not on file.   Social History Main Topics  . Smoking status: Former Smoker    Types: Cigarettes    Quit date: 08/03/1998  . Smokeless tobacco: Not on file  . Alcohol Use: 0.0 oz/week    0 Standard drinks or equivalent per week     Comment: occ  . Drug Use: No  . Sexual Activity: Not on file   Other Topics Concern  . Not on file   Social History Narrative    REVIEW OF SYSTEMS: Constitutional: No fevers, chills, or sweats, no generalized fatigue, change in appetite Eyes: No visual changes, double vision, eye pain Ear, nose and throat: No hearing loss, ear pain, nasal congestion, sore throat Cardiovascular: No chest pain, palpitations Respiratory:  No shortness of breath at rest or with exertion, wheezes GastrointestinaI: No nausea, vomiting, diarrhea, abdominal pain, fecal incontinence Genitourinary:  No dysuria, urinary retention or frequency Musculoskeletal:  No neck pain, back pain Integumentary: No rash, pruritus, skin lesions Neurological: as above Psychiatric: No depression, insomnia, anxiety Endocrine: No palpitations, fatigue, diaphoresis, mood swings, change in appetite, change in weight, increased thirst Hematologic/Lymphatic:  No anemia, purpura, petechiae. Allergic/Immunologic: no itchy/runny eyes, nasal congestion, recent allergic reactions, rashes  PHYSICAL EXAM: Filed Vitals:   11/07/15 1447  BP: 114/72  Pulse: 70   General: No acute distress.  Patient appears well-groomed.    Head:  Normocephalic/atraumatic Eyes:  Fundoscopic exam unremarkable without vessel changes, exudates, hemorrhages or papilledema. Neck: supple, no paraspinal  tenderness, full range of motion Heart:  Regular rate and rhythm Lungs:  Clear to auscultation bilaterally Back: No paraspinal tenderness Neurological Exam: alert and oriented to person, place, and time. Attention span and concentration intact, recent and remote memory intact, fund of knowledge intact.  Speech fluent and not dysarthric, language intact.  CN II-XII intact. Fundoscopic exam unremarkable without vessel changes, exudates, hemorrhages or papilledema.  Bulk and tone normal, muscle strength 5/5 throughout.  Sensation to light touch intact.  Deep tendon reflexes 2+ throughout.  Finger to nose and heel to shin testing intact.  Gait normal  IMPRESSION: Left sided numbness and tingling.  Unclear etiology.  It has since resolved.  Likely persistent migraine but TIA not ruled out. Persistent vertigo following migraine.  Stable Vestibular migraine.  PLAN: 1.  Given the persistent focal aura, triptans would be contraindicated.  She has failed multiple medications such as ibuprofen, naproxen, ketorolac, diclofenac, acetaminophen, Excedrin.  I believe she should be able to try Cambia and we will try ourselves to get this approved due to limited alternatives.  She also  has Zofran and meclizine as well for vertigo. 2.  Amitriptyline  at bedtime 3.  Will give her Toradol  injection today in office. 4.  Follow up in 3 months prior to move to [place]  15 minutes spent face to face with patient, over 50% spent discussing management.  Shon Millet, DO  CC:  Shirlean Mylar, MD

## 2015-11-07 NOTE — Progress Notes (Signed)
Chart forwarded.  

## 2015-12-09 ENCOUNTER — Telehealth: Payer: Self-pay | Admitting: Neurology

## 2015-12-09 NOTE — Telephone Encounter (Signed)
Christy Clayton 02-05-2061. She called today regarding migraine medication. She had said that the first medication her insurance company didn't accept it. I think there was another medication that y'all may have been looking into for her. Her number is [phone]. Thank you

## 2015-12-10 MED ORDER — DICLOFENAC POTASSIUM(MIGRAINE) 50 MG PO PACK: PACK | ORAL | Status: AC

## 2015-12-10 NOTE — Telephone Encounter (Signed)
Left detailed message on pt's machine that she should be able to pick up cambia now. To call back if she had further issues.

## 2015-12-10 NOTE — Telephone Encounter (Signed)
Pt discussing Cambia. Looks like it will be approved by insurance. Will call and let pt know after final conformation and 9 am.

## 2016-05-15 ENCOUNTER — Telehealth: Payer: Self-pay | Admitting: Neurology

## 2016-05-15 DIAGNOSIS — G43801 Other migraine, not intractable, with status migrainosus: Secondary | ICD-10-CM

## 2016-05-15 NOTE — Telephone Encounter (Signed)
Yes,that would be okay.

## 2016-05-15 NOTE — Telephone Encounter (Signed)
Attempted to reach pt to let her know that she would need to request MRI disc from [facility]. Line just continuously rang. Referral and OV notes, Labs, and MRI reports faxed to new neurologist.

## 2016-05-15 NOTE — Telephone Encounter (Signed)
Pt is relocating. Okay to place referral to:   [facility] [address] [place], [place] [id number] Tel: [phone] Fax: [phone]

## 2016-05-15 NOTE — Telephone Encounter (Signed)
Christy Clayton Oct 11, 2060. Her # [phone].TheTawanna Sat Fax # [phone].She said to make it  Attn Charise Killianarol Kooistra. She is needing a NP Referral due to moving and being new to the area [place] She was a patient of Dr. Moises BloodJaffe's and now is needing a New Neurologist since she has moved. She said she would need her information and MRI sent to them. Thank you

## 2016-09-10 IMAGING — CT CT HEAD W/O CM
2 series · 16 of 30 positions shown, 19 images · non-contrast
Comparison: None.

CLINICAL DATA: Left-sided arm and facial numbness. Onset today at
1334 hours. Code stroke.

EXAM:
CT HEAD WITHOUT CONTRAST
TECHNIQUE: Contiguous axial images were obtained from the base of the skull
through the vertex without intravenous contrast.

[Series 2: head w/o · axial · non-contrast · 0.42mm/px · z∈[+1683,+1803]mm · 9 of 30 slices shown, 12 images]
[im 3/30  brain]
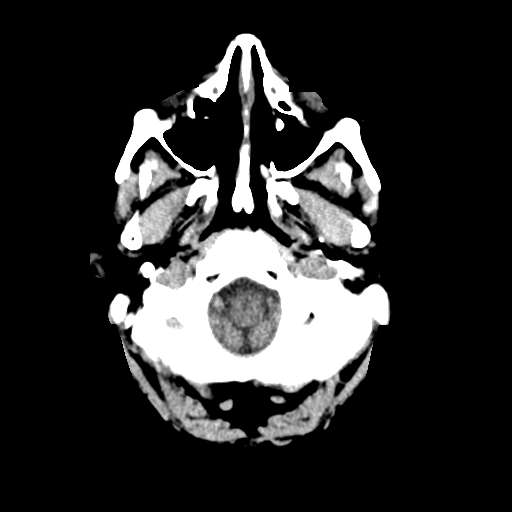
[im 3/30  bone]
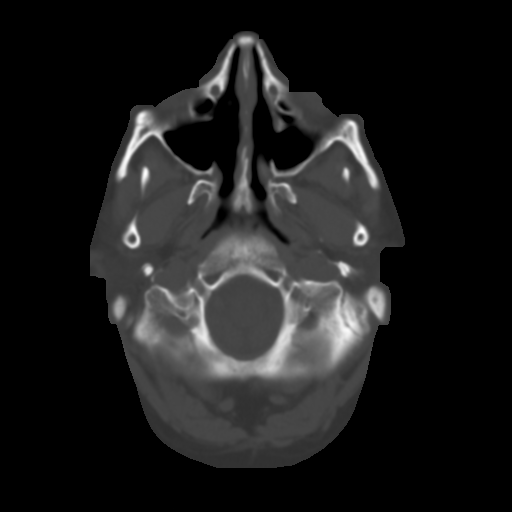
[im 6/30  brain]
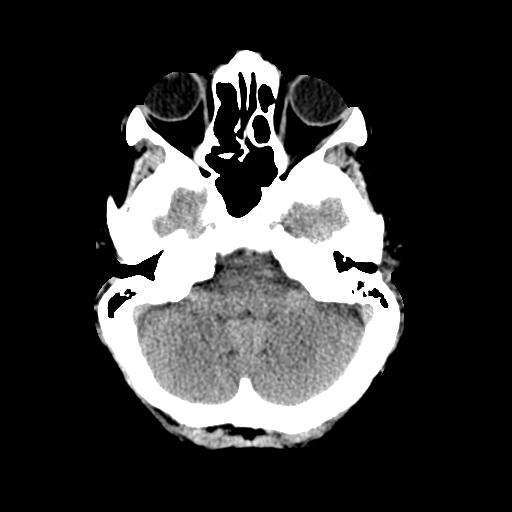
[im 9/30  brain]
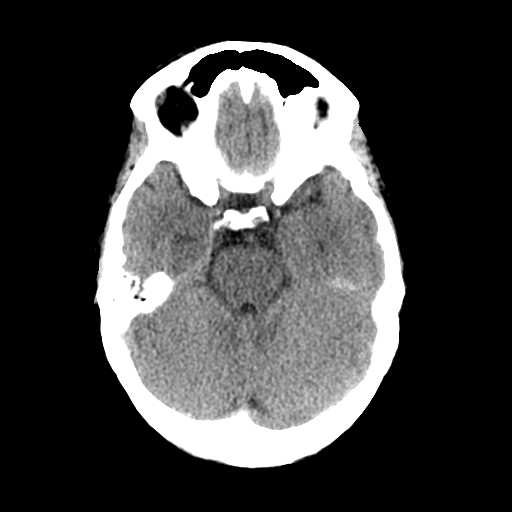
[im 12/30  brain]
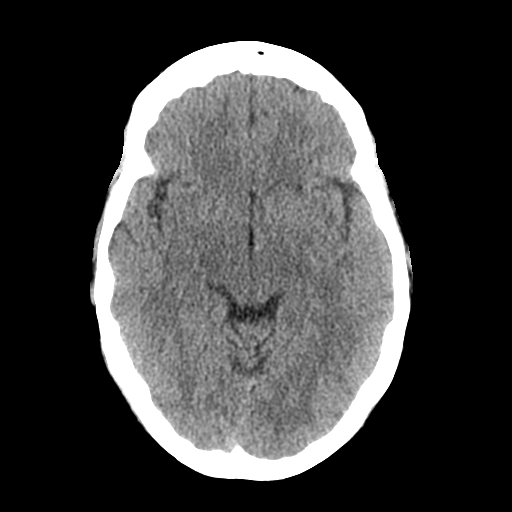
[im 15/30  brain]
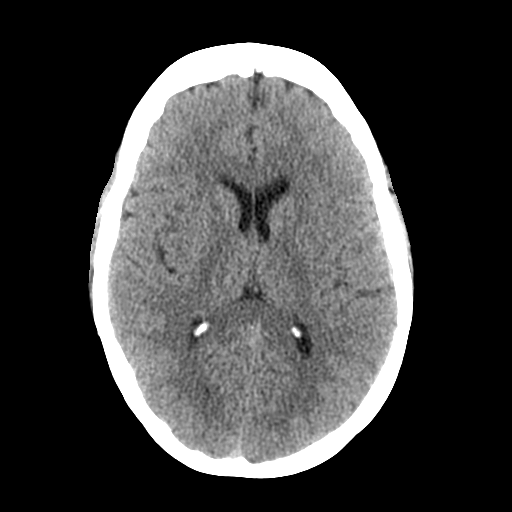
[im 15/30  bone]
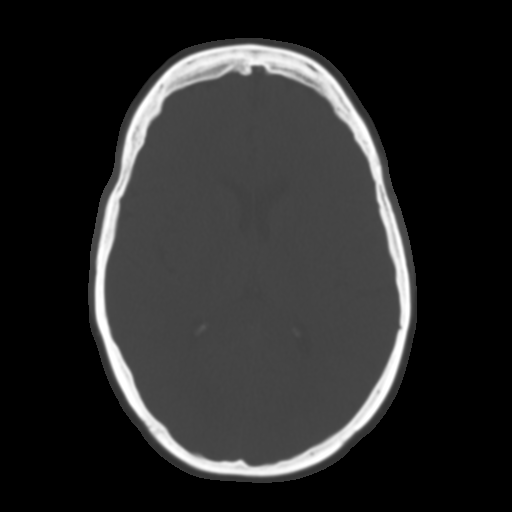
[im 18/30  brain]
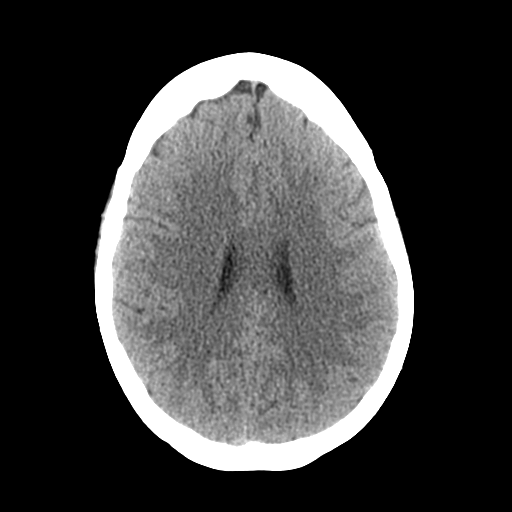
[im 21/30  brain]
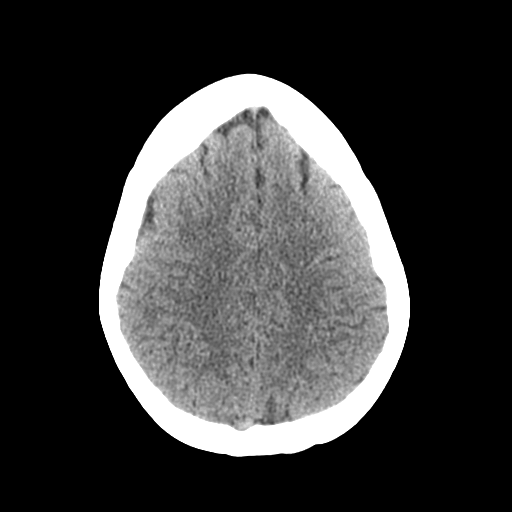
[im 24/30  brain]
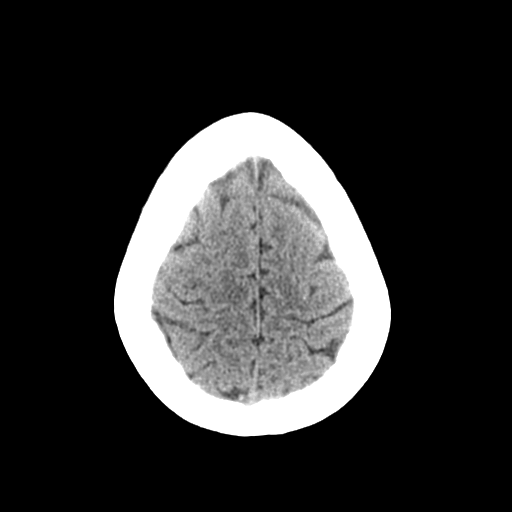
[im 27/30  brain]
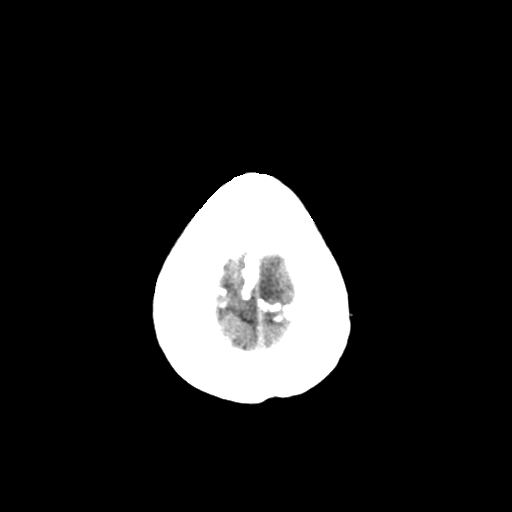
[im 27/30  bone]
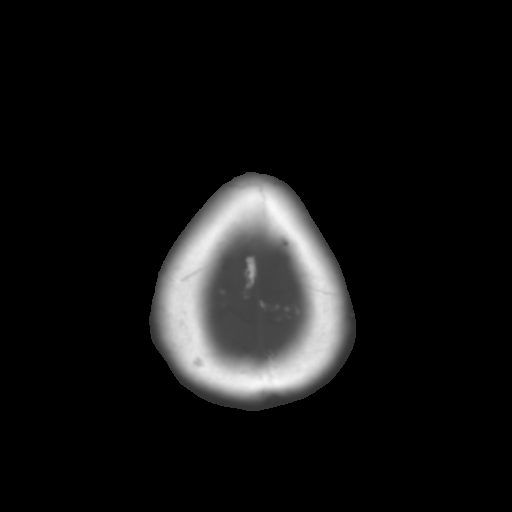

[Series 3: bone windows · axial · 0.42mm/px · z∈[+1688,+1787]mm · 7 of 50 slices shown]
[im 6/50  bone]
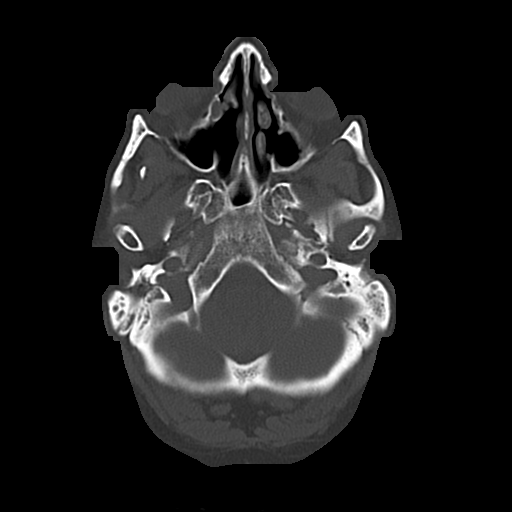
[im 11/50  bone]
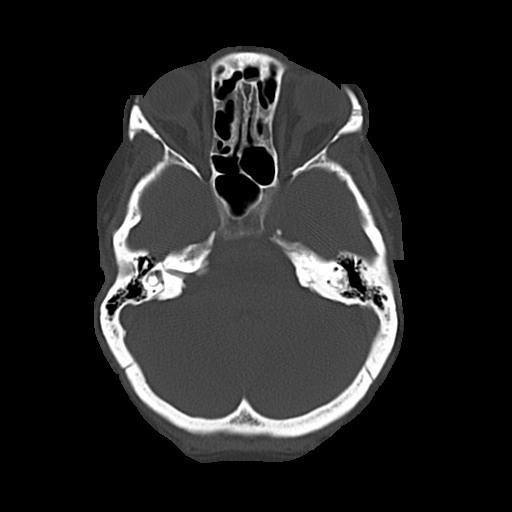
[im 17/50  bone]
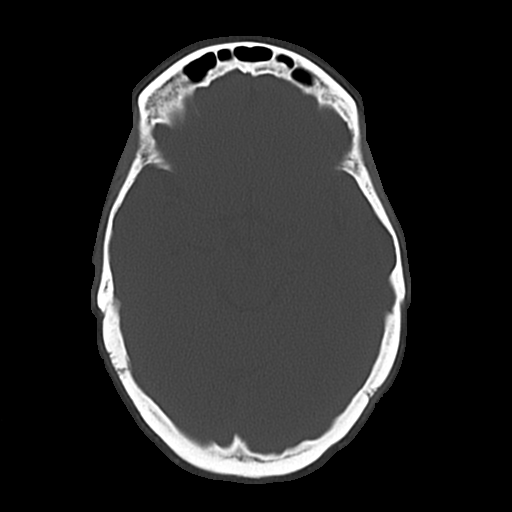
[im 22/50  bone]
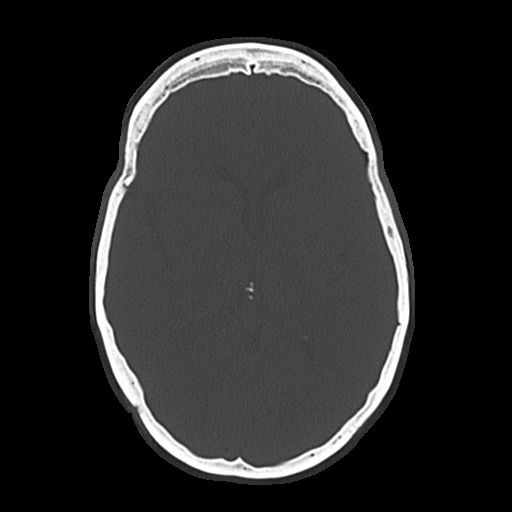
[im 28/50  bone]
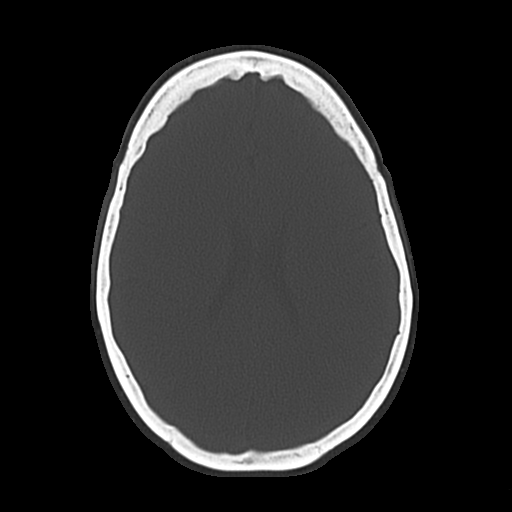
[im 33/50  bone]
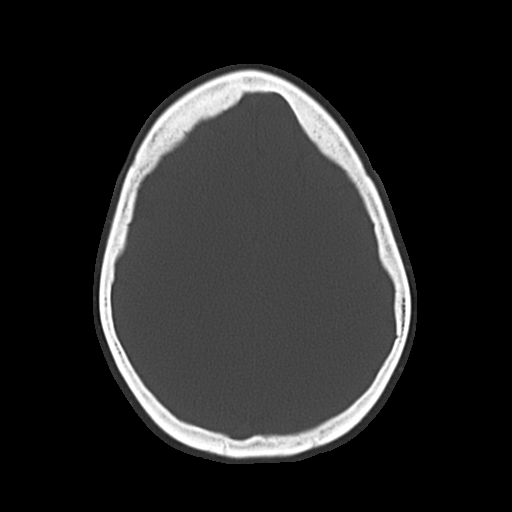
[im 39/50  bone]
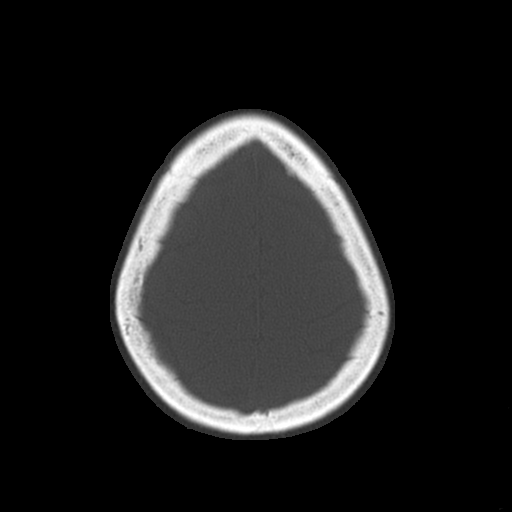

[16 of 30 positions shown; findings below may reference images not displayed]

FINDINGS: Ventricles and sulci appear symmetrical. No ventricular dilatation.
No mass effect or midline shift. No abnormal extra-axial fluid
collections. Gray-white matter junctions are distinct. Basal
cisterns are not effaced. No evidence of acute intracranial
hemorrhage. No depressed skull fractures. Mild mucosal thickening in
the paranasal sinuses. Loss of septation in the ethmoid air cells
consistent postoperative change. Bilateral ostiomeatal windows
interpreted tectum use. No acute air-fluid levels in the paranasal
sinuses. Mastoid air cells are not opacified. Congenital nonunion of
the posterior arch of C1.
IMPRESSION: No acute intracranial abnormalities.

These results were called by telephone at the time of interpretation
on 07/17/2015 at [DATE] to Dr. SOHER MAIA , who verbally
acknowledged these results.

## 2017-01-21 ENCOUNTER — Ambulatory Visit
Admit: 2017-01-21 | Discharge: 2017-01-21 | Payer: BLUE CROSS/BLUE SHIELD | Attending: Clinical Neurophysiology | Primary: Family Medicine

## 2017-01-21 DIAGNOSIS — G43109 Migraine with aura, not intractable, without status migrainosus: Secondary | ICD-10-CM

## 2017-01-21 NOTE — Progress Notes (Signed)
.  mzw

## 2017-01-21 NOTE — Progress Notes (Signed)
01/21/2017  Stacie LotSally J Luce 56 y.o. female      Seen at the request of Marge DuncansMark A Knipfer, MD  [address]  [place], [place] [id number]    Chief Complaint:  Chief Complaint   Patient presents with   ??? New Patient   ??? Migraine          HPI    Patient complains of headaches for 40 year(s). Vestibular migraine for 2 years, [place].   Description of pain: throbbing pain, unilateral in the left frontal area to occipital area, then bilateral.    Duration of individual headaches: hours to day(s), then evolved to vertigo; frequency 3-5 monthly.   Associated symptoms: dizziness/ vertigo following migraine if for HA several days; light sensitivity, nausea and vertigo related vomiting.   Pain relief: prescription medications - triptan therapy zomig but not taking anymore, NSAID's and dramamine for motion sickness.   Precipitating factors: weather change, sinus infection.   She denies a history of recent head injury.   Prior neurological history: negative for stroke, seizure disorders, meningitis, major head injuries.  Neurologic Review of Systems - no TIA or stroke-like symptoms, but steroid induced stroke like symptoms. Has cerebral aneurysm 3 mm left side by MRA in December 2016. BP high today. OSA, unknown degree, not tolerating CPAP.     Taking amitriptyline 25 mg for 17 years. Aspirin or Tylenal, stopped Zomig after a reaction to steroid.       Test result and imaging study review: Clinical summary was reviewed in care everywhere, labs were reviewed, normal CMP.      Past Medical History:  Past Medical History:   Diagnosis Date   ??? 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 Cerebral aneurysm, nonruptured 01/21/2017   ??? Hypertension    ??? Migraines        Past Surgical History:  Past Surgical History:   Procedure Laterality Date   ??? GERD TST W/ NASAL PH ELECTROD      sinus surgery   ??? HX CYST REMOVAL     ??? HX FRACTURE TX Left     left arm   ??? HX LUMBAR FUSION     ??? HX SHOULDER ARTHROSCOPY Left     ??? HX TOTAL ABDOMINAL HYSTERECTOMY         Social History:  Social History     Social History   ??? Marital status: UNKNOWN     Spouse name: N/A   ??? Number of children: N/A   ??? Years of education: N/A     Occupational History   ??? Not on file.     Social History Main Topics   ??? Smoking status: Not on file   ??? Smokeless tobacco: Not on file   ??? Alcohol use Not on file   ??? Drug use: Not on file   ??? Sexual activity: Not on file     Other Topics Concern   ??? Not on file     Social History Narrative   ??? No narrative on file       Family History:   Family History   Problem Relation Age of Onset   ??? Hypertension Mother    ??? Stroke Paternal Grandmother    ??? No Known Problems Child        Allergies   Allergen Reactions   ??? Allegra [Fexofenadine] Nausea and Vomiting   ??? Celebrex [Celecoxib] Nausea and Vomiting   ??? Codeine Nausea and Vomiting   ??? Cortaid [  Hydrocortisone] Rash   ??? Neurontin [Gabapentin] Nausea and Vomiting   ??? Oxycodone Nausea and Vomiting   ??? Prednisone Nausea and Vomiting   ??? Promethazine Rash   ??? Sulfa (Sulfonamide Antibiotics) Nausea and Vomiting       Review of Systems:  Review of Systems   HENT: Negative.    Eyes: Negative.    Respiratory: Negative.    Cardiovascular: Negative.    Gastrointestinal: Negative.    Genitourinary: Negative.    Musculoskeletal: Negative.    Skin: Negative.    Neurological: Positive for dizziness, weakness and headaches.   Endo/Heme/Allergies: Negative.    Psychiatric/Behavioral: Negative.        Vitals:    01/21/17 1614   BP: (!) 141/94   Pulse: 89   Resp: 20   Temp: (!) 100.6 ??F (38.1 ??C)   TempSrc: Oral   Weight: 190 lb (86.2 kg)   Height: 5\' 6"  (1.676 m)        Physical Exam   Constitutional: She is oriented to person, place, and time.   HENT:   Head: Normocephalic.   Eyes: Conjunctivae and EOM are normal. Pupils are equal, round, and reactive to light. Right eye exhibits no discharge. Left eye exhibits no discharge. No scleral icterus.   Neck: Normal range of motion.    Cardiovascular: Normal rate.    Pulmonary/Chest: Effort normal. No respiratory distress.   Musculoskeletal: Normal range of motion.   Neurological: She is alert and oriented to person, place, and time. She has normal strength and normal reflexes. She displays normal reflexes. No cranial nerve deficit. She exhibits normal muscle tone. She has a normal Finger-Nose-Finger Test, a normal Heel to [employer], a normal Romberg Test and a normal Tandem Gait Test. Gait normal. Coordination normal.   Reflex Scores:       Tricep reflexes are 2+ on the right side and 2+ on the left side.       Bicep reflexes are 2+ on the right side and 2+ on the left side.       Brachioradialis reflexes are 2+ on the right side and 2+ on the left side.       Patellar reflexes are 2+ on the right side and 2+ on the left side.       Achilles reflexes are 2+ on the right side and 2+ on the left side.  Skin: Skin is warm and dry.   Psychiatric: Her speech is normal. Mood, memory, affect and judgment normal.   Vitals reviewed.      Neurologic Exam     Mental Status   Oriented to person, place, and time.   Concentration: normal.   Speech: speech is normal   Level of consciousness: alert  Knowledge: good.   Normal comprehension.        Provided clear history, memory capacity was normal, no dysphasia.     Cranial Nerves     CN II   Visual fields full to confrontation.   Visual acuity: normal    CN III, IV, VI   Pupils are equal, round, and reactive to light.  Extraocular motions are normal.   Right pupil: Size: 4 mm. Shape: regular. Reactivity: brisk.   Left pupil: Size: 4 mm. Shape: regular. Reactivity: brisk.   Nystagmus: none   Diplopia: none  Ophthalmoparesis: none  Upgaze: normal    CN V   Facial sensation intact.     CN VII   Facial expression full, symmetric.  CN VIII   CN VIII normal.     CN IX, X   CN IX normal.   CN X normal.     CN XI   CN XI normal.     CN XII   CN XII normal.     Motor Exam   Muscle bulk: normal   Overall muscle tone: normal  Right arm pronator drift: absent  Left arm pronator drift: absent    Strength   Strength 5/5 throughout.        Finger tapping was normal bilaterally, right-handed     Sensory Exam   Light touch normal.     Gait, Coordination, and Reflexes     Gait  Gait: normal    Coordination   Romberg: negative  Finger to nose coordination: normal  Heel to shin coordination: normal  Tandem walking coordination: normal    Tremor   Resting tremor: absent  Intention tremor: absent  Action tremor: absent    Reflexes   Right brachioradialis: 2+  Left brachioradialis: 2+  Right biceps: 2+  Left biceps: 2+  Right triceps: 2+  Left triceps: 2+  Right patellar: 2+  Left patellar: 2+  Right achilles: 2+  Left achilles: 2+        Assessment / Plan:    1. Basilar migraine  Prolonged history of migraine, recent basilar migraine for 2 years.  BP was mildly high today.  We have discussed preventive treatment in detail.  Start topiramate sustained release 25 mg daily, hopefully this may become monotherapy in the future.  Side effects were discussed in detail.  She may continue to use over-the-counter pain relievers, and Dramamine for vertigo.  Alternatively, she may benefit from verapamil and magnesium combination as migraine prophylaxis if her blood pressure remains high sided.  - Trokendi 25 mg, 1 daily, sample 14.     2. Cerebral aneurysm, nonruptured  She will need MRA repeated by the end of this year.  She does not smoke.  Control hypertension would be a must.      I have spent 45 min, greater than 50% of discussing and counseling with patient, for treatment and diagnostic plan review.

## 2017-02-15 NOTE — Telephone Encounter (Signed)
Called pt and told her will have to get PA for Trokendi XR 25 mg

## 2017-02-15 NOTE — Telephone Encounter (Signed)
Patient called asking Trokendi XR 25 mg be called into [facility] on file. She would like a 90 day. She said the samples worked.

## 2017-04-09 ENCOUNTER — Telehealth

## 2017-04-09 NOTE — Telephone Encounter (Signed)
Pt called because she have not heard anything about the PA for Trokendi XL 25 mg. Pt states that she called her insurance company and was told that we canceled the PA. I don't see anything regarding that information. Did you want pt to try any other medications?

## 2017-04-12 NOTE — Telephone Encounter (Signed)
What is PA?  Go ahead to send Rx trokendi 25 mg, 1 daily, 30 ref 5

## 2017-04-14 MED ORDER — TOPIRAMATE ER 25 MG 24 HR CAP
25 mg | ORAL_CAPSULE | Freq: Every day | ORAL | 5 refills | Status: DC
Start: 2017-04-14 — End: 2017-09-30

## 2017-04-14 NOTE — Telephone Encounter (Signed)
Insurance approved Trokendi XR 25mg  but a PA will be needed on 05/03/2017.  Prescription sent to pharmacy.  Patient notified.

## 2017-07-20 ENCOUNTER — Ambulatory Visit
Admit: 2017-07-20 | Discharge: 2017-07-20 | Payer: BLUE CROSS/BLUE SHIELD | Attending: Clinical Neurophysiology | Primary: Family Medicine

## 2017-07-20 DIAGNOSIS — G43109 Migraine with aura, not intractable, without status migrainosus: Secondary | ICD-10-CM

## 2017-07-20 MED ORDER — TRAZODONE 50 MG TAB
50 mg | ORAL_TABLET | Freq: Every evening | ORAL | 3 refills | Status: DC
Start: 2017-07-20 — End: 2018-07-05

## 2017-07-20 NOTE — Progress Notes (Signed)
07/20/2017  Stacie Green 56 y.o. female      Chief Complaint:  Chief Complaint   Patient presents with   ??? Follow-up   ??? Migraine          Followup Note:   Migraine left side, originated from the left occipital extended to anterior, reduced to once a month, trokendi not working, duration 3-4 days. Morning headaches almost daily, drinking coffee helps. Lives alone, not sure if snores, sleep study done several years go, diagnosed sleep apnea, but not able to tolerate CPAP, "keeps her awake", even with Amitriptyline, on Amitriptyline more than 25 years, effect reached plateau; Trokendi 25 mg shortened daily headache period. Discussed up Trokendi to 50 mg. No kidney stone, + tingling side effect.       Review Test Results: I have reviewed imaging study and lab tests, discussed results with patient in detail. All questions were answered. 7 minutes spent.   M[facility] head done at [facility], reported to her negative.  Patient brought a CD to review, not able to open.   MRA HEAD WITHOUT CONTRAST11/05/2017  [facility]  Result Impression   IMPRESSION:    Negative exam.    Result Narrative   ---------------------------------------------    History: Facial paresthesias.    Findings:    MRA was performed of the head without gadolinium. NASCET criteria if indicated. Three-dimensional MRA reconstruction and reformatting was performed.????    Intracranial segments of the vertebral and internal carotid arteries are patent bilaterally.????The middle cerebral and anterior cerebral arteries are patent bilaterally.????The posterior cerebral arteries are patent.   Other Result Information   Interface, Radiology Results In - 06/12/2017 12:32 PM EST  ---------------------------------------------    History: Facial paresthesias.    Findings:    MRA was performed of the head without gadolinium. NASCET criteria if indicated. Three-dimensional MRA reconstruction and reformatting was performed.       Intracranial segments of the vertebral and internal carotid arteries are patent bilaterally.  The middle cerebral and anterior cerebral arteries are patent bilaterally.  The posterior cerebral arteries are patent.    IMPRESSION  IMPRESSION:    Negative exam.          Current Outpatient Medications   Medication Sig Dispense Refill   ??? topiramate ER (TROKENDI XR) 25 mg capsule Take 1 Cap by mouth daily. 30 Cap 5   ??? fosinopril-hydroCHLOROthiazide (MONOPRIL-HCT) 10-12.5 mg per tablet Take  by mouth daily.     ??? cetirizine (ZYRTEC) 10 mg tablet Take  by mouth.     ??? esomeprazole (NEXIUM) 40 mg capsule Take  by mouth daily.     ??? amitriptyline (ELAVIL) 25 mg tablet Take  by mouth nightly.     ??? rosuvastatin (CRESTOR) 5 mg tablet Take  by mouth nightly.     ??? ondansetron hcl (ZOFRAN) 8 mg tablet Take 8 mg by mouth every eight (8) hours as needed for Nausea.     ??? butalbital-aspirin-caffeine (FIORINAL) capsule Take 1 Cap by mouth every four (4) hours as needed for Pain.     ??? cholecalciferol, VITAMIN D3, (VITAMIN D3) 5,000 unit tab tablet Take  by mouth daily.     ??? multivitamin (ONE A DAY) tablet Take 1 Tab by mouth daily.          Allergies   Allergen Reactions   ??? Allegra [Fexofenadine] Nausea and Vomiting   ??? Celebrex [Celecoxib] Nausea and Vomiting   ??? Codeine Nausea and Vomiting   ???  Cortaid [Hydrocortisone] Rash   ??? Neurontin [Gabapentin] Nausea and Vomiting   ??? Oxycodone Nausea and Vomiting   ??? Prednisone Nausea and Vomiting   ??? Promethazine Rash   ??? Sulfa (Sulfonamide Antibiotics) Nausea and Vomiting         Review of Systems:  Review of Systems   Constitutional: Negative.    HENT: Negative.    Eyes: Negative.    Respiratory: Negative.    Cardiovascular: Negative.    Gastrointestinal: Negative.    Genitourinary: Negative.    Musculoskeletal: Negative.    Skin: Negative.    Neurological: Positive for headaches.   Endo/Heme/Allergies: Negative.    Psychiatric/Behavioral: Negative.        Examination:  Vitals:     07/20/17 1117   BP: 128/86   Pulse: 77   Resp: 18   Temp: 96.5 ??F (35.8 ??C)   TempSrc: Oral   SpO2: 97%   Weight: 186 lb (84.4 kg)   Height: 5\' 6"  (1.676 m)        Physical Exam   Constitutional: She is oriented to person, place, and time and well-developed, well-nourished, and in no distress.   HENT:   Head: Normocephalic.   Eyes: EOM are normal. Pupils are equal, round, and reactive to light. Right eye exhibits no discharge. Left eye exhibits no discharge. No scleral icterus.   Neck: Normal range of motion.   Cardiovascular: Normal rate.   Pulmonary/Chest: Effort normal. No respiratory distress.   Musculoskeletal: Normal range of motion.   Neurological: She is alert and oriented to person, place, and time. Gait normal. Coordination normal.   Psychiatric: Her speech is normal. Mood, memory, affect and judgment normal.   Vitals reviewed.      Neurologic Exam     Mental Status   Oriented to person, place, and time.   Concentration: normal.   Speech: speech is normal   Level of consciousness: alert  Knowledge: good.   Normal comprehension.     Cranial Nerves     CN III, IV, VI   Pupils are equal, round, and reactive to light.  Extraocular motions are normal.     CN VII   Facial expression full, symmetric.     CN VIII   CN VIII normal.     Motor Exam Moving bilateral upper and lower limbs well.      Gait, Coordination, and Reflexes     Gait  Gait: normal    Tremor   Resting tremor: absent        Assessment / Plan:    1. Basilar migraine  Recent MRA of the head done at [facility], reported as negative.  BP good range today.  Patient has daily morning headaches, advised her to be followed by sleep medicine for sleep apnea.  Her migraine nce a month lasting 3-4 days.  Will increase Trokendi to 50 mg, sample was given.  She was advised to drink adequate water to prevent kidney stone.  We have discussed CGRP blocker Aimovig which can be added  on as a second line migraine prophylaxis, injection can be done at her primary care physician's office.  Due to poor sleep, will start trazodone 50 mg and taper off amitriptyline.    Requested Prescriptions     Signed Prescriptions Disp Refills   ??? traZODone (DESYREL) 50 mg tablet 30 Tab 3     Sig: Take 1 Tab by mouth nightly.       I have spent 25 min,  greater than 50% of discussing and counseling with patient, for treatment and diagnostic plan review.

## 2017-09-30 ENCOUNTER — Encounter

## 2017-09-30 MED ORDER — TOPIRAMATE ER 25 MG 24 HR CAP
25 mg | ORAL_CAPSULE | Freq: Every day | ORAL | 5 refills | Status: DC
Start: 2017-09-30 — End: 2018-07-05

## 2018-01-18 ENCOUNTER — Encounter: Attending: Clinical Neurophysiology | Primary: Family Medicine

## 2018-07-05 ENCOUNTER — Ambulatory Visit: Attending: Medical | Primary: Family Medicine

## 2018-07-05 ENCOUNTER — Ambulatory Visit
Admit: 2018-07-05 | Discharge: 2018-07-05 | Payer: BLUE CROSS/BLUE SHIELD | Attending: Medical | Primary: Family Medicine

## 2018-07-05 DIAGNOSIS — H93293 Other abnormal auditory perceptions, bilateral: Secondary | ICD-10-CM

## 2018-07-05 NOTE — Progress Notes (Signed)
HPI:  Stacie Green is a 57 y.o. female seen as a new patient for Dizziness (Referred by Marge DuncansKnipfer, Mark A, MD for dizziness ).   Patient presents to the clinic for evaluation of dizziness. She describes this off balance and room spinning, but more off balance. She denies hearing loss or tinnitus. Patient states that the dizziness can be triggered by motion like walking or driving. She reports that the dizziness has been intermittent for years, but started again about 2 weeks ago. She reports that she has been taking meclizine which helps but puts her to sleep. She denies otalgia, otorrhea or OM. She denies any previous ear surgeries.    Of note, patient states that she was evaluated by ENT three years ago had balance testing which was negative for Meniere's disease. Patient states that she remembers being told about the crystals in her ear and she went to physical therapy. She reports that she has exercises to perform at home now but has not been able to do them like she should. Patient denies dizziness occurring with turning her had right or left.      Past Medical History, Past Surgical History, Family history, Social History, and Medications were all reviewed with the patient today and updated as necessary.     Allergies   Allergen Reactions   ??? Allegra [Fexofenadine] Nausea and Vomiting   ??? Celebrex [Celecoxib] Nausea and Vomiting   ??? Codeine Nausea and Vomiting   ??? Cortaid Maximum Strength  [Hydrocortisone Acetate] Other (comments)   ??? Cortaid [Hydrocortisone] Rash   ??? Neurontin [Gabapentin] Nausea and Vomiting   ??? Oxycodone Nausea and Vomiting   ??? Prednisone Nausea and Vomiting   ??? Promethazine Rash   ??? Rizatriptan Benzoate Other (comments)   ??? Sulfa (Sulfonamide Antibiotics) Nausea and Vomiting   ??? Sulfacetamide Other (comments)   ??? Sumatriptan Other (comments)     Patient Active Problem List   Diagnosis Code   ??? 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 Sleep disturbance G47.9      Current Outpatient Medications   Medication Sig   ??? meclizine (ANTIVERT) 25 mg tablet Take 25 mg by mouth.   ??? amitriptyline (ELAVIL) 25 mg tablet Take 25 mg by mouth.   ??? LORazepam (ATIVAN) 2 mg tablet Take 5 mg by mouth every six (6) hours as needed for Anxiety.   ??? fosinopril-hydroCHLOROthiazide (MONOPRIL-HCT) 10-12.5 mg per tablet Take  by mouth daily.   ??? cetirizine (ZYRTEC) 10 mg tablet Take  by mouth.   ??? esomeprazole (NEXIUM) 40 mg capsule Take  by mouth daily.   ??? rosuvastatin (CRESTOR) 5 mg tablet Take  by mouth nightly.   ??? ondansetron hcl (ZOFRAN) 8 mg tablet Take 8 mg by mouth every eight (8) hours as needed for Nausea.   ??? cholecalciferol, VITAMIN D3, (VITAMIN D3) 5,000 unit tab tablet Take  by mouth daily.   ??? multivitamin (ONE A DAY) tablet Take 1 Tab by mouth daily.     No current facility-administered medications for this visit.      Past Medical History:   Diagnosis Date   ??? 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 Cerebral aneurysm, nonruptured 01/21/2017   ??? Hypertension    ??? Migraines    ??? Sleep disturbance 07/20/2017     Social History     Tobacco Use   ??? Smoking status: Never Smoker   ??? Smokeless tobacco: Never Used   Substance Use Topics   ???  Alcohol use: Not on file     Past Surgical History:   Procedure Laterality Date   ??? GERD TST W/ NASAL PH ELECTROD      sinus surgery   ??? HX CYST REMOVAL     ??? HX FRACTURE TX Left     left arm   ??? HX LUMBAR FUSION     ??? HX SHOULDER ARTHROSCOPY Left    ??? HX TOTAL ABDOMINAL HYSTERECTOMY       Family History   Problem Relation Age of Onset   ??? Hypertension Mother    ??? Stroke Paternal Grandmother    ??? No Known Problems Child         ROS:    Review of Systems   Constitutional: Negative for fever.   HENT: Negative for hearing loss.    Eyes: Negative for blurred vision.   Respiratory: Negative for cough.    Cardiovascular: Negative for chest pain.   Gastrointestinal: Negative for heartburn.   Musculoskeletal: Negative for neck pain.   Skin: Negative for rash.    Neurological: Positive for dizziness.   Endo/Heme/Allergies: Does not bruise/bleed easily.   Psychiatric/Behavioral: Negative for depression.        PHYSICAL EXAM:    Visit Vitals  Pulse 94   Ht 5\' 6"  (1.676 m)   Wt 185 lb 9.6 oz (84.2 kg)   SpO2 (!) 83%   BMI 29.96 kg/m??       Head  Head and Face - The head and face are atraumatic, normocephalic.  The salivary glands are intact and the facial appearance is symmetric.    Head shape - No scars, lesions, or masses    Ear  Ear - Tympanic membranes are clear, the external auditory canal is without discharge and the tympanic membranes are mobile.  There is no tympanic membrane erythema and no middle ear opacity is visualized.    Pinna: bilateral - No hematomas or lacerations    Eye  Eyeball - bilateral - extraocular motions intact, equal in size and movement    Nose and Sinuses  Nose - mucosa is pink and the septum is deviated.  There are no nasal lesions and there was no turbinate hypertrophy.    Mouth and Throat  Lips - upper lip - normal: no dryness, cracking, pallor, cyanosis, or vesicular eruption.  Lower lip: normal: no dryness, cracking, pallor, cyanosis, or vesicular eruption.     Teeth and Gums - No bleeding, no inflammation or ulceration.    Lips - Pink and symmetrical  Oral Cavity - Oral mucosa pink, soft and hard palates contiguous and tongue moist without ulcers.  The mucosa is without ulcerations. No oral cavity masses present.   Parotid Gland - Bilateral - Non tender, not swollen.  Oropharynx - No discharge or Erythema  Nasopharynx - Non obstructed, mucosa pink and moist.    Hypopharynx - No erythema  Submandibular Gland - Non tender, not swollen.    Tonsils - Normal    Neck   Neck - Full range of motion and Supple.  Non Tender.   No Masses.    Trachea - Midline.  Thyroid - Gland - Symmetric.  Non Tender.  Nodules - No nodules.    Neurologic - II - XII Grossly intact bilaterally    Cardiac  Inspection - Jugular Vein:  Bilateral - non distended, no  prominent pulsations    Chest and Lung  Inspection - Movements:  Chest symmetrical with bilateral expansion, respirations even and  non labored      ASSESSMENT and PLAN      ICD-10-CM ICD-9-CM    1. Abnormal auditory perception of both ears H93.293 388.40 COMPREHENSIVE HEARING TEST   2. Dizziness R42 780.4        Audiogram obtained and is within normal limits. We discussed performing Dix-Hallpike in the office today but patient decline. She states that she was driving and did not want to be dizzy. We also dicussed obtaining VNG to further evaluate dizziness and will have her scheduled.     Thank you for the opportunity to participate in the care of this patient. Please let me know if you have any further questions or concerns.    Ahmed PrimaDanielle M Valborg Friar, PA-C  07/05/2018

## 2018-07-05 NOTE — Progress Notes (Signed)
AUDIOLOGY EVALUATION    Stacie Green had Audiometry performed today due to patient's complaint of dizziness    Results as follows:    Audiometry    Test Performed - Comprehensive Audiogram    Type of Loss - Right Ear: normal hearing                          Left Ear: normal hearing    SRT   Measurement Right Ear Left Ear   Value 10 10   Unit dB dB     Discrimination  Measurement Right Ear Left Ear   Value 100 % 96 %   Unit dB dB       Recommendations:    F/u with ENT    N. Page Pricilla Holmucker, AuD  Doctor of Audiology

## 2018-07-05 NOTE — Progress Notes (Signed)
AUDIOLOGY EVALUATION    Stacie Green had Audiometry performed today due to patient's complaint of dizziness    Results as follows:    Audiometry    Test Performed - Comprehensive Audiogram    Type of Loss - Right Ear: normal hearing                          Left Ear: normal hearing    SRT   Measurement Right Ear Left Ear   Value 10 10   Unit dB dB     Discrimination  Measurement Right Ear Left Ear   Value 100 % 96 %   Unit dB dB       Recommendations:    F/u with ENT    N. Page [name], AuD  Doctor of Audiology

## 2018-07-05 NOTE — Progress Notes (Signed)
HPI:  Stacie Green is a 57 y.o. female seen as a new patient for Dizziness (Referred by Marge DuncansKnipfer, Mark A, MD for dizziness ).   Patient presents to the clinic for evaluation of dizziness. She describes this off balance and room spinning, but more off balance. She denies hearing loss or tinnitus. Patient states that the dizziness can be triggered by motion like walking or driving. She reports that the dizziness has been intermittent for years, but started again about 2 weeks ago. She reports that she has been taking meclizine which helps but puts her to sleep. She denies otalgia, otorrhea or OM. She denies any previous ear surgeries.    Of note, patient states that she was evaluated by ENT three years ago had balance testing which was negative for Meniere's disease. Patient states that she remembers being told about the crystals in her ear and she went to physical therapy. She reports that she has exercises to perform at home now but has not been able to do them like she should. Patient denies dizziness occurring with turning her had right or left.      Past Medical History, Past Surgical History, Family history, Social History, and Medications were all reviewed with the patient today and updated as necessary.     Allergies   Allergen Reactions   ??? Allegra [Fexofenadine] Nausea and Vomiting   ??? Celebrex [Celecoxib] Nausea and Vomiting   ??? Codeine Nausea and Vomiting   ??? Cortaid Maximum Strength  [Hydrocortisone Acetate] Other (comments)   ??? Cortaid [Hydrocortisone] Rash   ??? Neurontin [Gabapentin] Nausea and Vomiting   ??? Oxycodone Nausea and Vomiting   ??? Prednisone Nausea and Vomiting   ??? Promethazine Rash   ??? Rizatriptan Benzoate Other (comments)   ??? Sulfa (Sulfonamide Antibiotics) Nausea and Vomiting   ??? Sulfacetamide Other (comments)   ??? Sumatriptan Other (comments)     Patient Active Problem List   Diagnosis Code   ??? 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 Sleep disturbance G47.9      Current Outpatient Medications   Medication Sig   ??? meclizine (ANTIVERT) 25 mg tablet Take 25 mg by mouth.   ??? amitriptyline (ELAVIL) 25 mg tablet Take 25 mg by mouth.   ??? LORazepam (ATIVAN) 2 mg tablet Take 5 mg by mouth every six (6) hours as needed for Anxiety.   ??? fosinopril-hydroCHLOROthiazide (MONOPRIL-HCT) 10-12.5 mg per tablet Take  by mouth daily.   ??? cetirizine (ZYRTEC) 10 mg tablet Take  by mouth.   ??? esomeprazole (NEXIUM) 40 mg capsule Take  by mouth daily.   ??? rosuvastatin (CRESTOR) 5 mg tablet Take  by mouth nightly.   ??? ondansetron hcl (ZOFRAN) 8 mg tablet Take 8 mg by mouth every eight (8) hours as needed for Nausea.   ??? cholecalciferol, VITAMIN D3, (VITAMIN D3) 5,000 unit tab tablet Take  by mouth daily.   ??? multivitamin (ONE A DAY) tablet Take 1 Tab by mouth daily.     No current facility-administered medications for this visit.      Past Medical History:   Diagnosis Date   ??? 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 Cerebral aneurysm, nonruptured 01/21/2017   ??? Hypertension    ??? Migraines    ??? Sleep disturbance 07/20/2017     Social History     Tobacco Use   ??? Smoking status: Never Smoker   ??? Smokeless tobacco: Never Used   Substance Use Topics   ???  Alcohol use: Not on file     Past Surgical History:   Procedure Laterality Date   ??? GERD TST W/ NASAL PH ELECTROD      sinus surgery   ??? HX CYST REMOVAL     ??? HX FRACTURE TX Left     left arm   ??? HX LUMBAR FUSION     ??? HX SHOULDER ARTHROSCOPY Left    ??? HX TOTAL ABDOMINAL HYSTERECTOMY       Family History   Problem Relation Age of Onset   ??? Hypertension Mother    ??? Stroke Paternal Grandmother    ??? No Known Problems Child         ROS:    Review of Systems   Constitutional: Negative for fever.   HENT: Negative for hearing loss.    Eyes: Negative for blurred vision.   Respiratory: Negative for cough.    Cardiovascular: Negative for chest pain.   Gastrointestinal: Negative for heartburn.   Musculoskeletal: Negative for neck pain.   Skin: Negative for rash.    Neurological: Positive for dizziness.   Endo/Heme/Allergies: Does not bruise/bleed easily.   Psychiatric/Behavioral: Negative for depression.        PHYSICAL EXAM:    Visit Vitals  Pulse 94   Ht 5\' 6"  (1.676 m)   Wt 185 lb 9.6 oz (84.2 kg)   SpO2 (!) 83%   BMI 29.96 kg/m??       Head  Head and Face - The head and face are atraumatic, normocephalic.  The salivary glands are intact and the facial appearance is symmetric.    Head shape - No scars, lesions, or masses    Ear  Ear - Tympanic membranes are clear, the external auditory canal is without discharge and the tympanic membranes are mobile.  There is no tympanic membrane erythema and no middle ear opacity is visualized.    Pinna: bilateral - No hematomas or lacerations    Eye  Eyeball - bilateral - extraocular motions intact, equal in size and movement    Nose and Sinuses  Nose - mucosa is pink and the septum is deviated.  There are no nasal lesions and there was no turbinate hypertrophy.    Mouth and Throat  Lips - upper lip - normal: no dryness, cracking, pallor, cyanosis, or vesicular eruption.  Lower lip: normal: no dryness, cracking, pallor, cyanosis, or vesicular eruption.     Teeth and Gums - No bleeding, no inflammation or ulceration.    Lips - Pink and symmetrical  Oral Cavity - Oral mucosa pink, soft and hard palates contiguous and tongue moist without ulcers.  The mucosa is without ulcerations. No oral cavity masses present.   Parotid Gland - Bilateral - Non tender, not swollen.  Oropharynx - No discharge or Erythema  Nasopharynx - Non obstructed, mucosa pink and moist.    Hypopharynx - No erythema  Submandibular Gland - Non tender, not swollen.    Tonsils - Normal    Neck   Neck - Full range of motion and Supple.  Non Tender.   No Masses.    Trachea - Midline.  Thyroid - Gland - Symmetric.  Non Tender.  Nodules - No nodules.    Neurologic - II - XII Grossly intact bilaterally    Cardiac   Inspection - Jugular Vein:  Bilateral - non distended, no prominent pulsations    Chest and Lung  Inspection - Movements:  Chest symmetrical with bilateral expansion, respirations even and  non labored      ASSESSMENT and PLAN      ICD-10-CM ICD-9-CM    1. Abnormal auditory perception of both ears H93.293 388.40 COMPREHENSIVE HEARING TEST   2. Dizziness R42 780.4        Audiogram obtained and is within normal limits. We discussed performing Dix-Hallpike in the office today but patient decline. She states that she was driving and did not want to be dizzy. We also dicussed obtaining VNG to further evaluate dizziness and will have her scheduled.     Thank you for the opportunity to participate in the care of this patient. Please let me know if you have any further questions or concerns.    Ahmed PrimaDanielle M Valborg Friar, PA-C  07/05/2018

## 2018-08-11 ENCOUNTER — Encounter

## 2018-09-06 NOTE — Telephone Encounter (Signed)
L/m to schedule balance testing.

## 2018-09-13 ENCOUNTER — Ambulatory Visit: Attending: Clinical Neurophysiology | Primary: Family Medicine

## 2018-09-13 ENCOUNTER — Ambulatory Visit: Admit: 2018-09-13 | Discharge: 2018-09-13 | Attending: Clinical Neurophysiology | Primary: Family Medicine

## 2018-09-13 DIAGNOSIS — G43111 Migraine with aura, intractable, with status migrainosus: Secondary | ICD-10-CM

## 2018-09-13 NOTE — Progress Notes (Signed)
09/13/2018  Stacie Green 58 y.o. female      Chief Complaint:  Chief Complaint   Patient presents with   ??? Migraine          Followup Note:   Migraine headaches less frequent, left side from periorbital extended to posterior, usually went to PCP office to get injection. HA days 3 days per week, woke with HA, wearing CPAP. Noticed regular headaches triggering vertigo. Since last November she has had daily vertigo with nausea, getting better, no tinnitus.     Started that she has brain aneurysm, told by neurologist in [place] in 2016, left cavernous ICA aneurysm. MRA here negative. Discussed CTA.     Amitriptyline helps but migraine but still frequent. topamax gave her tingling in arms. Imitrex, zomig helped. Taking fosinopril-HCTZ for HTN, HTN for about 6 years. No smoking.     Review Test Results: I have reviewed imaging study and lab tests, discussed results with patient in detail. All questions were answered. 5 minutes spent.   [facility]  Result Impression   IMPRESSION:    Negative exam.      MRI BRAIN WITHOUT CONTRAST11[facility]05/2017  [facility]  Result Impression   IMPRESSION: Negative exam         Current Outpatient Medications   Medication Sig Dispense Refill   ??? amitriptyline (ELAVIL) 25 mg tablet Take 25 mg by mouth.     ??? fosinopril-hydroCHLOROthiazide (MONOPRIL-HCT) 10-12.5 mg per tablet Take  by mouth daily.     ??? cetirizine (ZYRTEC) 10 mg tablet Take  by mouth.     ??? esomeprazole (NEXIUM) 40 mg capsule Take  by mouth daily.     ??? rosuvastatin (CRESTOR) 5 mg tablet Take  by mouth nightly.     ??? ondansetron hcl (ZOFRAN) 8 mg tablet Take 8 mg by mouth every eight (8) hours as needed for Nausea.     ??? cholecalciferol, VITAMIN D3, (VITAMIN D3) 5,000 unit tab tablet Take  by mouth daily.     ??? multivitamin (ONE A DAY) tablet Take 1 Tab by mouth daily.          Allergies   Allergen Reactions   ??? Allegra [Fexofenadine] Nausea and  Vomiting   ??? Celebrex [Celecoxib] Nausea and Vomiting   ??? Codeine Nausea and Vomiting   ??? Cortaid Maximum Strength  [Hydrocortisone Acetate] Other (comments)   ??? Cortaid [Hydrocortisone] Rash   ??? Neurontin [Gabapentin] Nausea and Vomiting   ??? Oxycodone Nausea and Vomiting   ??? Prednisone Nausea and Vomiting   ??? Promethazine Rash   ??? Rizatriptan Benzoate Other (comments)   ??? Sulfa (Sulfonamide Antibiotics) Nausea and Vomiting   ??? Sulfacetamide Other (comments)   ??? Sumatriptan Other (comments)     Review of Systems:  Review of Systems   Constitutional: Negative.    HENT: Negative.    Eyes: Negative.    Respiratory: Negative.    Cardiovascular: Negative.    Gastrointestinal: Negative.    Genitourinary: Negative.    Musculoskeletal: Negative.    Skin: Negative.    Neurological: Positive for dizziness.   Endo/Heme/Allergies: Negative.    Psychiatric/Behavioral: Negative.      Examination:  Vitals:    09/13/18 0936   BP: 126/82   Pulse: 69   Weight: 181 lb (82.1 kg)        Physical Exam  Vitals signs reviewed.   Eyes:      Extraocular Movements: Extraocular  movements intact and EOM normal.      Conjunctiva/sclera: Conjunctivae normal.      Pupils: Pupils are equal, round, and reactive to light.   Neck:      Musculoskeletal: Normal range of motion.   Cardiovascular:      Rate and Rhythm: Normal rate.   Pulmonary:      Effort: Pulmonary effort is normal. No respiratory distress.   Musculoskeletal: Normal range of motion.   Neurological:      General: No focal deficit present.      Mental Status: She is alert and oriented to person, place, and time. Mental status is at baseline.      Cranial Nerves: No cranial nerve deficit.      Sensory: No sensory deficit.      Motor: No weakness.      Coordination: Romberg Test abnormal. Finger-Nose-Finger Test normal.      Gait: Tandem walk abnormal. Gait normal.   Psychiatric:         Mood and Affect: Mood normal.         Speech: Speech normal.         Behavior: Behavior normal.          Thought Content: Thought content normal.         Judgment: Judgment normal.         Neurologic Exam     Mental Status   Oriented to person, place, and time.   Concentration: normal.   Speech: speech is normal   Level of consciousness: alert  Knowledge: good.   Normal comprehension.     Cranial Nerves     CN II   Visual fields full to confrontation.   Visual acuity: normal    CN III, IV, VI   Pupils are equal, round, and reactive to light.  Extraocular motions are normal.   Right pupil: Size: 3 mm. Shape: regular. Reactivity: brisk.   Left pupil: Shape: regular. Reactivity: brisk.   Nystagmus: none   Diplopia: none  Ophthalmoparesis: none  Upgaze: normal    CN VII   Facial expression full, symmetric.     CN VIII   CN VIII normal.     CN IX, X   CN IX normal.   CN X normal.     CN XI   CN XI normal.     CN XII   CN XII normal.     Motor Exam Moving BUE BLE well, finger tapping normal bilaterally     Sensory Exam   Light touch normal.     Gait, Coordination, and Reflexes     Coordination   Romberg: positive  Finger to nose coordination: normal  Tandem walking coordination: abnormal    Tremor   Resting tremor: absent  Intention tremor: absent  Action tremor: absent        Assessment / Plan:    1. Intractable migraine with aura with status migrainosus  We have discussed migraine management in detail and at length.  Limited options for migraine prophylaxis, see above note. Migraine days 12 per month. Has cerebral aneurysm. Continue amitriptyline helps her sleep and using CPAP, Start CGRP blocker, Emgality. Next option would be Botox therapy.   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 Daily vertigo with nausea since last November, lessened. Seeing ENT. Today's exam + Romberg, unable to tandem, no nystagmus found.     3. Cerebral aneurysm, nonruptured  Left cavernous ICA, MRA here was  negative. BP under control, nonsmoker. Will need a CT angiogram of the head to elucidate this problem.  Note is that  her migraine headaches are on the same side of ICA aneurysm.  - CTA HEAD; Future      I have spent 40 min, greater than 50% of discussing and counseling with patient, for treatment and diagnostic plan review.

## 2018-09-13 NOTE — Progress Notes (Addendum)
09/13/2018  Stacie Green 58 y.o. female      Chief Complaint:  Chief Complaint   Patient presents with   ??? Migraine          Followup Note:   Migraine headaches less frequent, left side from periorbital extended to posterior, usually went to PCP office to get injection. HA days 3 days per week, woke with HA, wearing CPAP. Noticed regular headaches triggering vertigo. Since last November she has had daily vertigo with nausea, getting better, no tinnitus.     Started that she has brain aneurysm, told by neurologist in [place] in 2016, left cavernous ICA aneurysm. MRA here negative. Discussed CTA.     Amitriptyline helps but migraine but still frequent. topamax gave her tingling in arms. Imitrex, zomig helped. Taking fosinopril-HCTZ for HTN, HTN for about 6 years. No smoking.     Review Test Results: I have reviewed imaging study and lab tests, discussed results with patient in detail. All questions were answered. 5 minutes spent.   MRA HEAD WITHOUT CONTRAST11/05/2017  [facility]  Result Impression   IMPRESSION:    Negative exam.      MRI BRAIN WITHOUT CONTRAST11/05/2017  [facility]  Result Impression   IMPRESSION: Negative exam         Current Outpatient Medications   Medication Sig Dispense Refill   ??? amitriptyline (ELAVIL) 25 mg tablet Take 25 mg by mouth.     ??? fosinopril-hydroCHLOROthiazide (MONOPRIL-HCT) 10-12.5 mg per tablet Take  by mouth daily.     ??? cetirizine (ZYRTEC) 10 mg tablet Take  by mouth.     ??? esomeprazole (NEXIUM) 40 mg capsule Take  by mouth daily.     ??? rosuvastatin (CRESTOR) 5 mg tablet Take  by mouth nightly.     ??? ondansetron hcl (ZOFRAN) 8 mg tablet Take 8 mg by mouth every eight (8) hours as needed for Nausea.     ??? cholecalciferol, VITAMIN D3, (VITAMIN D3) 5,000 unit tab tablet Take  by mouth daily.     ??? multivitamin (ONE A DAY) tablet Take 1 Tab by mouth daily.          Allergies   Allergen Reactions    ??? Allegra [Fexofenadine] Nausea and Vomiting   ??? Celebrex [Celecoxib] Nausea and Vomiting   ??? Codeine Nausea and Vomiting   ??? Cortaid Maximum Strength  [Hydrocortisone Acetate] Other (comments)   ??? Cortaid [Hydrocortisone] Rash   ??? Neurontin [Gabapentin] Nausea and Vomiting   ??? Oxycodone Nausea and Vomiting   ??? Prednisone Nausea and Vomiting   ??? Promethazine Rash   ??? Rizatriptan Benzoate Other (comments)   ??? Sulfa (Sulfonamide Antibiotics) Nausea and Vomiting   ??? Sulfacetamide Other (comments)   ??? Sumatriptan Other (comments)     Review of Systems:  Review of Systems   Constitutional: Negative.    HENT: Negative.    Eyes: Negative.    Respiratory: Negative.    Cardiovascular: Negative.    Gastrointestinal: Negative.    Genitourinary: Negative.    Musculoskeletal: Negative.    Skin: Negative.    Neurological: Positive for dizziness.   Endo/Heme/Allergies: Negative.    Psychiatric/Behavioral: Negative.      Examination:  Vitals:    09/13/18 0936   BP: 126/82   Pulse: 69   Weight: 181 lb (82.1 kg)        Physical Exam  Vitals signs reviewed.   Eyes:      Extraocular Movements: Extraocular  movements intact and EOM normal.      Conjunctiva/sclera: Conjunctivae normal.      Pupils: Pupils are equal, round, and reactive to light.   Neck:      Musculoskeletal: Normal range of motion.   Cardiovascular:      Rate and Rhythm: Normal rate.   Pulmonary:      Effort: Pulmonary effort is normal. No respiratory distress.   Musculoskeletal: Normal range of motion.   Neurological:      General: No focal deficit present.      Mental Status: She is alert and oriented to person, place, and time. Mental status is at baseline.      Cranial Nerves: No cranial nerve deficit.      Sensory: No sensory deficit.      Motor: No weakness.      Coordination: Romberg Test abnormal. Finger-Nose-Finger Test normal.      Gait: Tandem walk abnormal. Gait normal.   Psychiatric:         Mood and Affect: Mood normal.         Speech: Speech normal.          Behavior: Behavior normal.         Thought Content: Thought content normal.         Judgment: Judgment normal.         Neurologic Exam     Mental Status   Oriented to person, place, and time.   Concentration: normal.   Speech: speech is normal   Level of consciousness: alert  Knowledge: good.   Normal comprehension.     Cranial Nerves     CN II   Visual fields full to confrontation.   Visual acuity: normal    CN III, IV, VI   Pupils are equal, round, and reactive to light.  Extraocular motions are normal.   Right pupil: Size: 3 mm. Shape: regular. Reactivity: brisk.   Left pupil: Shape: regular. Reactivity: brisk.   Nystagmus: none   Diplopia: none  Ophthalmoparesis: none  Upgaze: normal    CN VII   Facial expression full, symmetric.     CN VIII   CN VIII normal.     CN IX, X   CN IX normal.   CN X normal.     CN XI   CN XI normal.     CN XII   CN XII normal.     Motor Exam Moving BUE BLE well, finger tapping normal bilaterally     Sensory Exam   Light touch normal.     Gait, Coordination, and Reflexes     Coordination   Romberg: positive  Finger to nose coordination: normal  Tandem walking coordination: abnormal    Tremor   Resting tremor: absent  Intention tremor: absent  Action tremor: absent        Assessment / Plan:    1. Intractable migraine with aura with status migrainosus  We have discussed migraine management in detail and at length.  Limited options for migraine prophylaxis, see above note. Migraine days 12 per month. Has cerebral aneurysm. Continue amitriptyline helps her sleep and using CPAP, Start CGRP blocker, Emgality. Next option would be Botox therapy.   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 Daily vertigo with nausea since last November, lessened. Seeing ENT. Today's exam + Romberg, unable to tandem, no nystagmus found.     3. Cerebral aneurysm, nonruptured  Left cavernous ICA, MRA here was  negative. BP under control, nonsmoker.  Will need a CT angiogram of the head to elucidate this problem.  Note is that her migraine headaches are on the same side of ICA aneurysm.  - CTA HEAD; Future      I have spent 40 min, greater than 50% of discussing and counseling with patient, for treatment and diagnostic plan review.

## 2018-09-21 NOTE — Telephone Encounter (Signed)
Xanax 1 mg, 1 take 40 min prior to MRI, may repeat 1 during MRI.   2 tbs, no ref

## 2018-09-21 NOTE — Telephone Encounter (Signed)
Patient is having a MRI on 09/26/2018 and is claustrophobic please send in a medication to the [facility] at [facility].

## 2018-09-26 ENCOUNTER — Inpatient Hospital Stay: Admit: 2018-09-26 | Payer: BLUE CROSS/BLUE SHIELD | Attending: Clinical Neurophysiology | Primary: Family Medicine

## 2018-09-26 DIAGNOSIS — I671 Cerebral aneurysm, nonruptured: Secondary | ICD-10-CM

## 2018-09-26 LAB — AMB POC CREATININE
GFR African American: >60 mL/min/{1.73_m2} (ref >60)
GFR Non-African American: >60 mL/min/{1.73_m2} (ref >60)
POC Creatinine: 0.8 mg/dL (ref 0.8–1.5)

## 2018-09-26 LAB — CREATININE, POC
Creatinine (POC): 0.8 mg/dL (ref 0.8–1.5)
GFRAA, POC: >60 mL/min/{1.73_m2} (ref >60)
GFRNA, POC: >60 mL/min/{1.73_m2} (ref >60)

## 2018-09-26 MED ORDER — SODIUM CHLORIDE 0.9% BOLUS IV
0.9 % | Freq: Once | INTRAVENOUS | Status: AC
Start: 2018-09-26 — End: 2018-09-26
  Administered 2018-09-26: 15:00:00 via INTRAVENOUS

## 2018-09-26 MED ORDER — SALINE PERIPHERAL FLUSH PRN
Freq: Once | INTRAMUSCULAR | Status: AC
Start: 2018-09-26 — End: 2018-09-26
  Administered 2018-09-26: 15:00:00

## 2018-09-26 MED ORDER — IOPAMIDOL 76 % IV SOLN
76 % | Freq: Once | INTRAVENOUS | Status: AC
Start: 2018-09-26 — End: 2018-09-26
  Administered 2018-09-26: 15:00:00 via INTRAVENOUS

## 2018-09-26 NOTE — Progress Notes (Signed)
Please notify patient, CTA showed similar finding that you knew of: a tiny 2 mm aneurysm arising from the left cavernous carotid artery.  No additional aneurysm identified, no blockage.

## 2018-09-26 NOTE — Progress Notes (Signed)
Please notify patient, CTA showed similar finding that you knew of: a tiny 2 mm aneurysm arising from the left cavernous carotid artery.  No additional aneurysm identified, no blockage.

## 2018-09-27 NOTE — Telephone Encounter (Addendum)
Patient called back I read to her what Dr. Dimas Aguas reported on the CTA. The patient understood and stated that she is still having problems with dizziness in her head and doesn't know what the next step is. She has already been to her ENT and they have addressed the [facility] but stated the dizziness would be Neurology. Please advise the patient.

## 2018-09-27 NOTE — Telephone Encounter (Signed)
Continue Emgality monthly subcu injection and make sure patient is getting it on time, will get botox therapy approved if pt agrees.  Goal is to control migraines so as to reduce migraine related vertigo.

## 2018-09-28 NOTE — Telephone Encounter (Signed)
Called placed to PT several times unable to contact her

## 2018-10-11 ENCOUNTER — Encounter: Attending: Clinical Neurophysiology | Primary: Family Medicine

## 2018-10-14 ENCOUNTER — Ambulatory Visit: Attending: Clinical Neurophysiology | Primary: Family Medicine

## 2018-10-14 ENCOUNTER — Ambulatory Visit
Admit: 2018-10-14 | Discharge: 2018-10-14 | Payer: BLUE CROSS/BLUE SHIELD | Attending: Clinical Neurophysiology | Primary: Family Medicine

## 2018-10-14 DIAGNOSIS — G43111 Migraine with aura, intractable, with status migrainosus: Secondary | ICD-10-CM

## 2018-10-14 MED ORDER — AIMOVIG AUTOINJECTOR 70 MG/ML SUBCUTANEOUS AUTO-INJECTOR
70 mg/mL | SUBCUTANEOUS | 11 refills | Status: DC
Start: 2018-10-14 — End: 2018-11-30

## 2018-10-14 NOTE — Progress Notes (Signed)
AIMOVIG  APPLIED TODAY ON THE R THIGH, LOT # [id number] EXP [id number], NDC [phone]

## 2018-10-14 NOTE — Progress Notes (Signed)
Patient presents for 1st aimovig injection. Migraine days 12/ month, not changed     VITAL SIGNS:  Visit Vitals  BP 132/84   Pulse 84   Wt 183 lb (83 kg)   BMI 29.54 kg/m??       Common adverse reactions including injection site reaction and constipation were reviewed with patient.     Aimovig dose is 70 mg.  This is given as a subcutaneous injection once a month.   The syringe is very small, you will never see the syringe.     Reviewed proper use of auto injector including:   1. Storage of medication in refrigerator until use and remove 30 min prior to use.   Once removed from the refrigerator, the medication is good for 7 days.  Do no place back in refrigerator.   2. Gather materials needed prior to injection  3. Review injection sites  4. Clean injection site  5. Remove cap from auto injector  6. Firmly press injector at 90 degree angle against site and press the purple start button. You will hear a loud click. Let go of the injector button.   7. Continue to press injector against you skin for about 15 seconds, the window will turn yellow when injection is complete.  8. Dispose the auto injector in a sharps container.      Patient understood the above education, demonstrated proper self injection technique with sample syringe and administered first self dose properly in office.    Patient tolerated the injection well without immediate adverse reaction.     Dose given today:  Aimovig 70 mg subcutaneous injection  Site: Right front thigh  Lot No :   Exp date:   NDC:    AIMOVIG  APPLIED TODAY ON THE R THIGH, LOT # [id number] EXP [id number], NDC [phone]

## 2018-10-14 NOTE — Progress Notes (Signed)
10/14/2018  Stacie Green 58 y.o. female      Chief Complaint:  Chief Complaint   Patient presents with   ??? Dizziness          Followup Note:   Headaches arose from the left occipital region, frequency 3 per week, last week none. Would like to have Aimovig 70 mg today.      Dizziness following the headache and vertigo since the last October 2019. meclizine side effect of drowsiness. Had PT for 2 weeks, balance improved. No ear symptoms.     trokendi gave her side effect tingling, stopped. On amitriptyline 25 mg.     Review Test Results: I have reviewed imaging study and lab tests, discussed results with patient in detail. All questions were answered. 5 minutes spent.   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 No additional aneurysms identified. No high-grade stenosis or  occlusion.  ??  ??   Imaging     CTA HEAD (Order: [id number]) - 09/26/2018       Current Outpatient Medications   Medication Sig Dispense Refill   ??? amitriptyline (ELAVIL) 25 mg tablet Take 25 mg by mouth.     ??? fosinopril-hydroCHLOROthiazide (MONOPRIL-HCT) 10-12.5 mg per tablet Take  by mouth daily.     ??? cetirizine (ZYRTEC) 10 mg tablet Take  by mouth.     ??? esomeprazole (NEXIUM) 40 mg capsule Take  by mouth daily.     ??? rosuvastatin (CRESTOR) 5 mg tablet Take  by mouth nightly.     ??? ondansetron hcl (ZOFRAN) 8 mg tablet Take 8 mg by mouth every eight (8) hours as needed for Nausea.     ??? cholecalciferol, VITAMIN D3, (VITAMIN D3) 5,000 unit tab tablet Take  by mouth daily.     ??? multivitamin (ONE A DAY) tablet Take 1 Tab by mouth daily.          Allergies   Allergen Reactions   ??? Allegra [Fexofenadine] Nausea and Vomiting   ??? Celebrex [Celecoxib] Nausea and Vomiting   ??? Codeine Nausea and Vomiting   ??? Cortaid Maximum Strength  [Hydrocortisone Acetate] Other (comments)   ??? Cortaid [Hydrocortisone] Rash   ??? Neurontin [Gabapentin] Nausea and Vomiting   ??? Oxycodone Nausea  and Vomiting   ??? Prednisone Nausea and Vomiting   ??? Promethazine Rash   ??? Rizatriptan Benzoate Other (comments)   ??? Sulfa (Sulfonamide Antibiotics) Nausea and Vomiting   ??? Sulfacetamide Other (comments)   ??? Sumatriptan Other (comments)     Review of Systems:  Review of Systems   Neurological: Positive for dizziness and headaches.   All other systems reviewed and are negative.    Examination:  Vitals:    10/14/18 1411   BP: 132/84   Pulse: 84   Weight: 183 lb (83 kg)        Physical Exam  Vitals signs reviewed.   Constitutional:       Appearance: Normal appearance.   HENT:      Head: Normocephalic.   Eyes:      Extraocular Movements: Extraocular movements intact and EOM normal.      Conjunctiva/sclera: Conjunctivae normal.      Pupils: Pupils are equal, round, and reactive to light.   Neck:      Musculoskeletal: Normal range of motion.   Cardiovascular:      Rate and Rhythm: Normal rate.   Pulmonary:  Effort: Pulmonary effort is normal. No respiratory distress.   Musculoskeletal: Normal range of motion.   Neurological:      General: No focal deficit present.      Mental Status: She is alert and oriented to person, place, and time.      Cranial Nerves: No cranial nerve deficit.      Sensory: No sensory deficit.      Motor: No weakness.      Coordination: Coordination normal.      Gait: Gait is intact. Gait normal.      Deep Tendon Reflexes: Reflexes normal.   Psychiatric:         Mood and Affect: Mood normal.         Speech: Speech normal.         Behavior: Behavior normal.         Thought Content: Thought content normal.         Judgment: Judgment normal.         Neurologic Exam     Mental Status   Oriented to person, place, and time.   Concentration: normal.   Speech: speech is normal   Level of consciousness: alert  Knowledge: good.   Able to name object.     Cranial Nerves     CN II   Visual acuity: normal    CN III, IV, VI   Pupils are equal, round, and reactive to light.  Extraocular motions are normal.    Right pupil: Size: 3 mm. Shape: regular.   Left pupil: Size: 3 mm. Shape: regular.   Nystagmus: none   Diplopia: none  Ophthalmoparesis: none  Upgaze: normal    CN VII   Facial expression full, symmetric.     CN VIII   CN VIII normal.     Motor Exam   Muscle bulk: normal  Overall muscle tone: normal    Gait, Coordination, and Reflexes     Gait  Gait: normal    Tremor   Resting tremor: absent        Assessment / Plan:    1. Intractable migraine with aura with status migrainosus  Average 3/week, start Aimovig, continue amitriptyline.    2. Cerebral aneurysm, nonruptured  Tiny 2 mm left cavernous ICA, unchanged compared to the previous study.    3. Vestibular disequilibrium, unspecified laterality  Patient had a clear onset last October, associated with the headaches, gradually improving, suggestive of probable viral infection causing vestibular neuronopathy.  Currently constant disequilibrium, no positional factor.  Other consideration would be basilar migraine due to frequent migraine, hopefully, Aimovig will play a role.      I have spent 25 min, greater than 50% of discussing and counseling with patient, for treatment and diagnostic plan review.

## 2018-10-14 NOTE — Progress Notes (Signed)
10/14/2018  Stacie Green 58 y.o. female      Chief Complaint:  Chief Complaint   Patient presents with   ??? Dizziness          Followup Note:   Headaches arose from the left occipital region, frequency 3 per week, last week none. Would like to have Aimovig 70 mg today.      Dizziness following the headache and vertigo since the last October 2019. meclizine side effect of drowsiness. Had PT for 2 weeks, balance improved. No ear symptoms.     trokendi gave her side effect tingling, stopped. On amitriptyline 25 mg.     Review Test Results: I have reviewed imaging study and lab tests, discussed results with patient in detail. All questions were answered. 5 minutes spent.   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 No additional aneurysms identified. No high-grade stenosis or  occlusion.  ??  ??   Imaging     [facility] HEAD (Order: [id number]) - 09/26/2018       Current Outpatient Medications   Medication Sig Dispense Refill   ??? amitriptyline (ELAVIL) 25 mg tablet Take 25 mg by mouth.     ??? fosinopril-hydroCHLOROthiazide (MONOPRIL-HCT) 10-12.5 mg per tablet Take  by mouth daily.     ??? cetirizine (ZYRTEC) 10 mg tablet Take  by mouth.     ??? esomeprazole (NEXIUM) 40 mg capsule Take  by mouth daily.     ??? rosuvastatin (CRESTOR) 5 mg tablet Take  by mouth nightly.     ??? ondansetron hcl (ZOFRAN) 8 mg tablet Take 8 mg by mouth every eight (8) hours as needed for Nausea.     ??? cholecalciferol, VITAMIN D3, (VITAMIN D3) 5,000 unit tab tablet Take  by mouth daily.     ??? multivitamin (ONE A DAY) tablet Take 1 Tab by mouth daily.          Allergies   Allergen Reactions   ??? Allegra [Fexofenadine] Nausea and Vomiting   ??? Celebrex [Celecoxib] Nausea and Vomiting   ??? Codeine Nausea and Vomiting   ??? Cortaid Maximum Strength  [Hydrocortisone Acetate] Other (comments)   ??? Cortaid [Hydrocortisone] Rash   ??? Neurontin [Gabapentin] Nausea and Vomiting    ??? Oxycodone Nausea and Vomiting   ??? Prednisone Nausea and Vomiting   ??? Promethazine Rash   ??? Rizatriptan Benzoate Other (comments)   ??? Sulfa (Sulfonamide Antibiotics) Nausea and Vomiting   ??? Sulfacetamide Other (comments)   ??? Sumatriptan Other (comments)     Review of Systems:  Review of Systems   Neurological: Positive for dizziness and headaches.   All other systems reviewed and are negative.    Examination:  Vitals:    10/14/18 1411   BP: 132/84   Pulse: 84   Weight: 183 lb (83 kg)        Physical Exam  Vitals signs reviewed.   Constitutional:       Appearance: Normal appearance.   HENT:      Head: Normocephalic.   Eyes:      Extraocular Movements: Extraocular movements intact and EOM normal.      Conjunctiva/sclera: Conjunctivae normal.      Pupils: Pupils are equal, round, and reactive to light.   Neck:      Musculoskeletal: Normal range of motion.   Cardiovascular:      Rate and Rhythm: Normal rate.   Pulmonary:  Effort: Pulmonary effort is normal. No respiratory distress.   Musculoskeletal: Normal range of motion.   Neurological:      General: No focal deficit present.      Mental Status: She is alert and oriented to person, place, and time.      Cranial Nerves: No cranial nerve deficit.      Sensory: No sensory deficit.      Motor: No weakness.      Coordination: Coordination normal.      Gait: Gait is intact. Gait normal.      Deep Tendon Reflexes: Reflexes normal.   Psychiatric:         Mood and Affect: Mood normal.         Speech: Speech normal.         Behavior: Behavior normal.         Thought Content: Thought content normal.         Judgment: Judgment normal.         Neurologic Exam     Mental Status   Oriented to person, place, and time.   Concentration: normal.   Speech: speech is normal   Level of consciousness: alert  Knowledge: good.   Able to name object.     Cranial Nerves     CN II   Visual acuity: normal    CN III, IV, VI   Pupils are equal, round, and reactive to light.   Extraocular motions are normal.   Right pupil: Size: 3 mm. Shape: regular.   Left pupil: Size: 3 mm. Shape: regular.   Nystagmus: none   Diplopia: none  Ophthalmoparesis: none  Upgaze: normal    CN VII   Facial expression full, symmetric.     CN VIII   CN VIII normal.     Motor Exam   Muscle bulk: normal  Overall muscle tone: normal    Gait, Coordination, and Reflexes     Gait  Gait: normal    Tremor   Resting tremor: absent        Assessment / Plan:    1. Intractable migraine with aura with status migrainosus  Average 3/week, start Aimovig, continue amitriptyline.    2. Cerebral aneurysm, nonruptured  Tiny 2 mm left cavernous ICA, unchanged compared to the previous study.    3. Vestibular disequilibrium, unspecified laterality  Patient had a clear onset last October, associated with the headaches, gradually improving, suggestive of probable viral infection causing vestibular neuronopathy.  Currently constant disequilibrium, no positional factor.  Other consideration would be basilar migraine due to frequent migraine, hopefully, Aimovig will play a role.      I have spent 25 min, greater than 50% of discussing and counseling with patient, for treatment and diagnostic plan review.

## 2018-10-14 NOTE — Progress Notes (Signed)
AIMOVIG  APPLIED TODAY ON THE R [facility]IGH, LOT # [id number] EXP 0621, NDC [phone]

## 2018-10-14 NOTE — Progress Notes (Signed)
Patient presents for 1st aimovig injection. Migraine days 12/ month, not changed     VITAL SIGNS:  Visit Vitals  BP 132/84   Pulse 84   Wt 183 lb (83 kg)   BMI 29.54 kg/m??       Common adverse reactions including injection site reaction and constipation were reviewed with patient.     Aimovig dose is 70 mg.  This is given as a subcutaneous injection once a month.   The syringe is very small, you will never see the syringe.     Reviewed proper use of auto injector including:   1. Storage of medication in refrigerator until use and remove 30 min prior to use.   Once removed from the refrigerator, the medication is good for 7 days.  Do no place back in refrigerator.   2. Gather materials needed prior to injection  3. Review injection sites  4. Clean injection site  5. Remove cap from auto injector  6. Firmly press injector at 90 degree angle against site and press the purple start button. You will hear a loud click. Let go of the injector button.   7. Continue to press injector against you skin for about 15 seconds, the window will turn yellow when injection is complete.  8. Dispose the auto injector in a sharps container.      Patient understood the above education, demonstrated proper self injection technique with sample syringe and administered first self dose properly in office.    Patient tolerated the injection well without immediate adverse reaction.     Dose given today:  Aimovig 70 mg subcutaneous injection  Site: Right front thigh  Lot No :   Exp date:   NDC:    AIMOVIG  APPLIED TODAY ON THE R [facility]IGH, LOT # [id number] EXP 0621, NDC [phone]

## 2018-11-03 NOTE — Telephone Encounter (Signed)
PA for Aimovig 70mg  approved from 11/03/18 to 01/31/19

## 2018-11-30 ENCOUNTER — Encounter

## 2018-11-30 NOTE — Telephone Encounter (Signed)
Patient left message stating "I need to change meds again, I had a severe reaction."    Patient reports "the Aimovig caused severe cramping day and night to my stomach, back, I felt bloated, and constipated."  States "also I was still having 2-3 headaches per week."  Patient reports "the last 2 weeks I have increased my Amitriptyline 25mg  to 2 tabs at night and I have not had any headaches."    Patient requests to have updated script sent to [facility] for a 90 day supply.    Please advise, thanks.

## 2018-11-30 NOTE — Telephone Encounter (Signed)
Sure, amitriptyline 25 mg, 2 qhs, 180, ref 3. Weight watch.

## 2018-11-30 NOTE — Telephone Encounter (Signed)
aimovig dc'd due to side effects reported by pt.

## 2018-12-01 MED ORDER — AMITRIPTYLINE 25 MG TAB
25 mg | ORAL_TABLET | Freq: Every evening | ORAL | 3 refills | Status: AC
Start: 2018-12-01 — End: ?

## 2018-12-01 NOTE — Telephone Encounter (Signed)
Patient notified of orders from Dr. Dimas Aguas and that Amitriptyline can potentially cause weight gain.  Patient verbalized understanding and requests release of information be mailed to her.  Patient states she will be moving soon and would like records sent to her.  Instructed will place in the mail for her today.

## 2018-12-01 NOTE — Telephone Encounter (Signed)
Prescription sent to local pharmacy.  Left message for patient to return our call.

## 2019-04-06 ENCOUNTER — Encounter: Attending: Clinical Neurophysiology | Primary: Family Medicine
